# Patient Record
Sex: Female | Born: 1960 | Race: White | Hispanic: No | Marital: Married | Smoking: Current every day smoker
Health system: Southern US, Community
[De-identification: ages and names within clinical notes are randomized; demographics above are authoritative.]

## PROBLEM LIST (undated history)

## (undated) DIAGNOSIS — I10 Essential (primary) hypertension: Secondary | ICD-10-CM

## (undated) DIAGNOSIS — E039 Hypothyroidism, unspecified: Secondary | ICD-10-CM

## (undated) DIAGNOSIS — E079 Disorder of thyroid, unspecified: Secondary | ICD-10-CM

## (undated) HISTORY — PX: CHOLECYSTECTOMY: SHX55

## (undated) HISTORY — PX: ABDOMINAL HYSTERECTOMY: SHX81

## (undated) HISTORY — PX: APPENDECTOMY: SHX54

---

## 2014-02-23 ENCOUNTER — Emergency Department (HOSPITAL_COMMUNITY): Payer: 59

## 2014-02-23 ENCOUNTER — Inpatient Hospital Stay (HOSPITAL_COMMUNITY)
Admission: EM | Admit: 2014-02-23 | Discharge: 2014-02-27 | DRG: 392 | Disposition: A | Discharge status: Home / Self Care | Payer: 59 | Attending: Internal Medicine | Admitting: Internal Medicine

## 2014-02-23 ENCOUNTER — Encounter (HOSPITAL_COMMUNITY): Payer: Self-pay | Admitting: Emergency Medicine

## 2014-02-23 DIAGNOSIS — R739 Hyperglycemia, unspecified: Secondary | ICD-10-CM | POA: Diagnosis present

## 2014-02-23 DIAGNOSIS — E876 Hypokalemia: Secondary | ICD-10-CM | POA: Diagnosis present

## 2014-02-23 DIAGNOSIS — R197 Diarrhea, unspecified: Secondary | ICD-10-CM

## 2014-02-23 DIAGNOSIS — A09 Infectious gastroenteritis and colitis, unspecified: Secondary | ICD-10-CM | POA: Diagnosis not present

## 2014-02-23 DIAGNOSIS — Z833 Family history of diabetes mellitus: Secondary | ICD-10-CM

## 2014-02-23 DIAGNOSIS — Y903 Blood alcohol level of 60-79 mg/100 ml: Secondary | ICD-10-CM | POA: Diagnosis present

## 2014-02-23 DIAGNOSIS — E860 Dehydration: Secondary | ICD-10-CM | POA: Diagnosis present

## 2014-02-23 DIAGNOSIS — E871 Hypo-osmolality and hyponatremia: Secondary | ICD-10-CM | POA: Diagnosis present

## 2014-02-23 DIAGNOSIS — F17210 Nicotine dependence, cigarettes, uncomplicated: Secondary | ICD-10-CM | POA: Diagnosis present

## 2014-02-23 DIAGNOSIS — I10 Essential (primary) hypertension: Secondary | ICD-10-CM | POA: Diagnosis present

## 2014-02-23 DIAGNOSIS — R1030 Lower abdominal pain, unspecified: Secondary | ICD-10-CM

## 2014-02-23 DIAGNOSIS — E039 Hypothyroidism, unspecified: Secondary | ICD-10-CM | POA: Diagnosis present

## 2014-02-23 DIAGNOSIS — R109 Unspecified abdominal pain: Secondary | ICD-10-CM | POA: Diagnosis not present

## 2014-02-23 DIAGNOSIS — F10129 Alcohol abuse with intoxication, unspecified: Secondary | ICD-10-CM | POA: Diagnosis present

## 2014-02-23 DIAGNOSIS — Z9119 Patient's noncompliance with other medical treatment and regimen: Secondary | ICD-10-CM | POA: Diagnosis present

## 2014-02-23 DIAGNOSIS — N179 Acute kidney failure, unspecified: Secondary | ICD-10-CM | POA: Diagnosis present

## 2014-02-23 DIAGNOSIS — E872 Acidosis: Secondary | ICD-10-CM | POA: Diagnosis present

## 2014-02-23 DIAGNOSIS — K529 Noninfective gastroenteritis and colitis, unspecified: Secondary | ICD-10-CM

## 2014-02-23 DIAGNOSIS — K921 Melena: Secondary | ICD-10-CM | POA: Diagnosis present

## 2014-02-23 DIAGNOSIS — D509 Iron deficiency anemia, unspecified: Secondary | ICD-10-CM | POA: Diagnosis present

## 2014-02-23 HISTORY — DX: Essential (primary) hypertension: I10

## 2014-02-23 HISTORY — DX: Disorder of thyroid, unspecified: E07.9

## 2014-02-23 HISTORY — DX: Hypothyroidism, unspecified: E03.9

## 2014-02-23 LAB — CBC WITH DIFFERENTIAL/PLATELET
Basophils Absolute: 0.2 10*3/uL — ABNORMAL HIGH (ref 0.0–0.1)
Basophils Relative: 1 % (ref 0–1)
EOS PCT: 1 % (ref 0–5)
Eosinophils Absolute: 0.2 10*3/uL (ref 0.0–0.7)
HCT: 29.2 % — ABNORMAL LOW (ref 36.0–46.0)
HEMOGLOBIN: 8.4 g/dL — AB (ref 12.0–15.0)
Lymphocytes Relative: 12 % (ref 12–46)
Lymphs Abs: 2.1 10*3/uL (ref 0.7–4.0)
MCH: 20.8 pg — AB (ref 26.0–34.0)
MCHC: 28.8 g/dL — AB (ref 30.0–36.0)
MCV: 72.5 fL — ABNORMAL LOW (ref 78.0–100.0)
MONO ABS: 1.5 10*3/uL — AB (ref 0.1–1.0)
Monocytes Relative: 9 % (ref 3–12)
NEUTROS PCT: 77 % (ref 43–77)
Neutro Abs: 13.1 10*3/uL — ABNORMAL HIGH (ref 1.7–7.7)
Platelets: 360 10*3/uL (ref 150–400)
RBC: 4.03 MIL/uL (ref 3.87–5.11)
RDW: 17.8 % — ABNORMAL HIGH (ref 11.5–15.5)
WBC: 17.1 10*3/uL — ABNORMAL HIGH (ref 4.0–10.5)

## 2014-02-23 LAB — COMPREHENSIVE METABOLIC PANEL
ALBUMIN: 4.5 g/dL (ref 3.5–5.2)
ALK PHOS: 72 U/L (ref 39–117)
ALT: 17 U/L (ref 0–35)
ANION GAP: 22 — AB (ref 5–15)
AST: 35 U/L (ref 0–37)
BUN: 16 mg/dL (ref 6–23)
CO2: 17 mEq/L — ABNORMAL LOW (ref 19–32)
Calcium: 10.1 mg/dL (ref 8.4–10.5)
Chloride: 91 mEq/L — ABNORMAL LOW (ref 96–112)
Creatinine, Ser: 1.25 mg/dL — ABNORMAL HIGH (ref 0.50–1.10)
GFR calc Af Amer: 55 mL/min — ABNORMAL LOW (ref >90)
GFR calc non Af Amer: 48 mL/min — ABNORMAL LOW (ref >90)
Glucose, Bld: 130 mg/dL — ABNORMAL HIGH (ref 70–99)
POTASSIUM: 3 meq/L — AB (ref 3.7–5.3)
Sodium: 130 mEq/L — ABNORMAL LOW (ref 137–147)
TOTAL PROTEIN: 8.3 g/dL (ref 6.0–8.3)
Total Bilirubin: 0.2 mg/dL — ABNORMAL LOW (ref 0.3–1.2)

## 2014-02-23 LAB — PROTIME-INR
INR: 1.12 (ref 0.00–1.49)
Prothrombin Time: 14.6 seconds (ref 11.6–15.2)

## 2014-02-23 LAB — POC OCCULT BLOOD, ED: FECAL OCCULT BLD: POSITIVE — AB

## 2014-02-23 LAB — ETHANOL: Alcohol, Ethyl (B): 75 mg/dL — ABNORMAL HIGH (ref 0–11)

## 2014-02-23 LAB — LIPASE, BLOOD: Lipase: 42 U/L (ref 11–59)

## 2014-02-23 MED ORDER — SODIUM CHLORIDE 0.9 % IV BOLUS (SEPSIS)
1000.0000 mL | Freq: Once | INTRAVENOUS | Status: AC
Start: 2014-02-23 — End: 2014-02-24
  Administered 2014-02-23: 1000 mL via INTRAVENOUS

## 2014-02-23 MED ORDER — MORPHINE SULFATE 2 MG/ML IJ SOLN
2.0000 mg | Freq: Once | INTRAMUSCULAR | Status: AC
Start: 2014-02-23 — End: 2014-02-23
  Administered 2014-02-23: 2 mg via INTRAVENOUS
  Filled 2014-02-23: qty 1

## 2014-02-23 MED ORDER — IOHEXOL 300 MG/ML  SOLN
100.0000 mL | Freq: Once | INTRAMUSCULAR | Status: AC | PRN
  Administered 2014-02-23: 100 mL via INTRAVENOUS

## 2014-02-23 MED ORDER — ONDANSETRON HCL 4 MG/2ML IJ SOLN
4.0000 mg | Freq: Once | INTRAMUSCULAR | Status: AC
  Administered 2014-02-23: 4 mg via INTRAVENOUS
  Filled 2014-02-23: qty 2

## 2014-02-23 NOTE — ED Notes (Signed)
[facility] Expected date:  Expected time:  Means of arrival:  Comments: EMS abd pain / ETOH

## 2014-02-23 NOTE — ED Notes (Signed)
Patient transported to CT 

## 2014-02-23 NOTE — ED Notes (Signed)
Per EMS, patient was picked up from a bar with consuming 6 beers. Patient is complaining of abd pain that started 4 days ago but got worse after drinking. Pain rated 10/10. Also, reported to EMS that patient is constipated and last bowel movement was one week ago. Attempted to use suppository with no relief.

## 2014-02-23 NOTE — ED Notes (Signed)
Pt stated she wanted to use bathroom at this time--- requested a bedpan.  Was encouraged by staff to use bathroom----- able to ambulate to the bathroom without assistance, gait and balance steady.

## 2014-02-23 NOTE — ED Provider Notes (Signed)
CSN: [id number]     Arrival date & time 02/23/14  2115 History   First MD Initiated Contact with Patient 02/23/14 2139     Chief Complaint  Patient presents with  . Abdominal Pain  . Alcohol Intoxication  . Constipation     (Consider location/radiation/quality/duration/timing/severity/associated sxs/prior Treatment) Patient is a 53 y.o. female presenting with abdominal pain, intoxication, and constipation. The history is provided by the patient and medical records. No language interpreter was used.  Abdominal Pain Associated symptoms: constipation and nausea   Associated symptoms: no chest pain, no cough, no diarrhea, no dysuria, no fatigue, no fever, no hematuria, no shortness of breath and no vomiting   Alcohol Intoxication Associated symptoms include abdominal pain and nausea. Pertinent negatives include no chest pain, coughing, diaphoresis, fatigue, fever, headaches, neck pain, rash, vomiting or weakness.  Constipation Associated symptoms: abdominal pain and nausea   Associated symptoms: no back pain, no diarrhea, no dysuria, no fever and no vomiting      Chandra BatchSherry Spindler is a 53 y.o. female  with a hx of HTN, hypothyroid presents to the Emergency Department complaining of gradual, persistent, progressively worsening lower abd pain onset 1 hour PTA.  Pt reports constipation for the last 1 week without any BMs.  She reports she and a friend went out tonight and she had 6+ beers at the bar.  Pt reports this is not a lot of EtOH for her but denies drinking regularly. Pt reports she became diaphoretic and thought she might vomit, but was unable to do so.  She reports hysterectomy but denies additional abd surgeries.  Pt denies fever, chills, headache, neck pain, chest pain, SOB, dysuria, hematuria.  Pt reports after arriving in the ED she has had several bouts of BRBPR.  Pt denies blood thinner or hx of hemorrhoids.    Past Medical History  Diagnosis Date  . Hypertension   . Thyroid  disease   . Hypothyroidism    Past Surgical History  Procedure Laterality Date  . Appendectomy    . Abdominal hysterectomy    . Cesarean section    . Cholecystectomy     History reviewed. No pertinent family history. History  Substance Use Topics  . Smoking status: Current Every Day Smoker  . Smokeless tobacco: Never Used  . Alcohol Use: Yes   OB History    No data available     Review of Systems  Constitutional: Negative for fever, diaphoresis, appetite change, fatigue and unexpected weight change.  HENT: Negative for mouth sores and trouble swallowing.   Eyes: Negative for visual disturbance.  Respiratory: Negative for cough, chest tightness, shortness of breath, wheezing and stridor.   Cardiovascular: Negative for chest pain and palpitations.  Gastrointestinal: Positive for nausea, abdominal pain, constipation and blood in stool. Negative for vomiting, diarrhea, abdominal distention and rectal pain.  Endocrine: Negative for polydipsia, polyphagia and polyuria.  Genitourinary: Negative for dysuria, urgency, frequency, hematuria, flank pain and difficulty urinating.  Musculoskeletal: Negative for back pain, neck pain and neck stiffness.  Skin: Negative for rash.  Allergic/Immunologic: Negative for immunocompromised state.  Neurological: Negative for syncope, weakness, light-headedness and headaches.  Hematological: Negative for adenopathy. Does not bruise/bleed easily.  Psychiatric/Behavioral: Negative for confusion and sleep disturbance. The patient is not nervous/anxious.   All other systems reviewed and are negative.     Allergies  Sulfa antibiotics  Home Medications   Prior to Admission medications   Medication Sig Start Date End Date Taking? Authorizing Provider  ibuprofen (  ADVIL,MOTRIN) 200 MG tablet Take 800 mg by mouth every 6 (six) hours as needed for moderate pain.   Yes Historical Provider, MD  lisinopril (PRINIVIL,ZESTRIL) 20 MG tablet Take 20 mg by mouth  daily.   Yes Historical Provider, MD  mometasone (NASONEX) 50 MCG/ACT nasal spray Place 1 spray into the nose 2 (two) times daily.   Yes Historical Provider, MD   BP 147/57 mmHg  Pulse 69  Temp(Src) 97.4 F (36.3 C) (Oral)  Resp 20  SpO2 99% Physical Exam  Constitutional: She is oriented to person, place, and time. She appears well-developed and well-nourished. No distress.  Awake, alert, nontoxic appearance  HENT:  Head: Normocephalic and atraumatic.  Mouth/Throat: Oropharynx is clear and moist. No oropharyngeal exudate.  Eyes: Conjunctivae are normal. No scleral icterus.  Neck: Normal range of motion. Neck supple.  Cardiovascular: Normal rate, regular rhythm, normal heart sounds and intact distal pulses.   Pulmonary/Chest: Effort normal and breath sounds normal. No respiratory distress. She has no wheezes.  Equal chest expansion  Abdominal: Soft. Bowel sounds are normal. She exhibits no distension and no mass. There is tenderness in the right lower quadrant, suprapubic area and left lower quadrant. There is guarding. There is no CVA tenderness.  Significant TTP of the lower abd without lateralization of the pain; pt denies rebound tenderness but is very uncomfortable during exam; no TTP of the epigastrium RUQ or LUQ  Obese abd  Musculoskeletal: Normal range of motion. She exhibits no edema.  Neurological: She is alert and oriented to person, place, and time. Coordination normal.  Speech is clear and goal oriented Moves extremities without ataxia  Skin: Skin is warm and dry. She is not diaphoretic. No erythema.  Psychiatric: She has a normal mood and affect.  Nursing note and vitals reviewed.   ED Course  Procedures (including critical care time) Labs Review Labs Reviewed  COMPREHENSIVE METABOLIC PANEL - Abnormal; Notable for the following:    Sodium 130 (*)    Potassium 3.0 (*)    Chloride 91 (*)    CO2 17 (*)    Glucose, Bld 130 (*)    Creatinine, Ser 1.25 (*)    Total  Bilirubin 0.2 (*)    GFR calc non Af Amer 48 (*)    GFR calc Af Amer 55 (*)    Anion gap 22 (*)    All other components within normal limits  CBC WITH DIFFERENTIAL - Abnormal; Notable for the following:    WBC 17.1 (*)    Hemoglobin 8.4 (*)    HCT 29.2 (*)    MCV 72.5 (*)    MCH 20.8 (*)    MCHC 28.8 (*)    RDW 17.8 (*)    Neutro Abs 13.1 (*)    Monocytes Absolute 1.5 (*)    Basophils Absolute 0.2 (*)    All other components within normal limits  ETHANOL - Abnormal; Notable for the following:    Alcohol, Ethyl (B) 75 (*)    All other components within normal limits  POC OCCULT BLOOD, ED - Abnormal; Notable for the following:    Fecal Occult Bld POSITIVE (*)    All other components within normal limits  LIPASE, BLOOD  URINALYSIS, ROUTINE W REFLEX MICROSCOPIC  PROTIME-INR  I-STAT CG4 LACTIC ACID, ED    Imaging Review Ct Abdomen Pelvis W Contrast  02/24/2014   CLINICAL DATA:  Acute lower abdominal pain LEFT greater than RIGHT onset tonight, nausea, vomiting, past history of hysterectomy and  appendectomy, hypertension, smoking  EXAM: CT ABDOMEN AND PELVIS WITH CONTRAST  TECHNIQUE: Multidetector CT imaging of the abdomen and pelvis was performed using the standard protocol following bolus administration of intravenous contrast. Sagittal and coronal MPR images reconstructed from axial data set.  CONTRAST:  OMNIPAQUE IOHEXOL 300 MG/ML  SOLN  COMPARISON:  None  FINDINGS: Minimal dependent density lash atelectasis at LEFT lower lobe.  Gallbladder, appendix, and uterus surgically absent with normal sized ovaries.  Liver, spleen, pancreas, kidneys, and adrenal glands normal appearance.  Although under distended, suspect diffuse wall thickening of the transverse and descending colon, questionably extending into the sigmoid colon as well, suspicious for a diffuse colitis.  Stomach and small bowel loops normal appearance.  No mass, adenopathy, hernia, free fluid, or free air.  Mild  degenerative disc disease changes thoracic spine and at L5-S1.  IMPRESSION: Suspect diffuse colitis involving the transverse, descending, and likely sigmoid colon.  Differential diagnosis would include infection and inflammatory bowel disease, ischemia considered unlikely due to distribution and lack of significant vascular-occlusive disease changes.   Electronically Signed   By: Ulyses Southward M.D.   On: 02/24/2014 00:18     EKG Interpretation None      MDM   Final diagnoses:  Lower abdominal pain  Colitis  Bloody diarrhea   Veverly Larimer presents with constipation, lower abd pain and c/o bloody diarrhea here in the ED.  Pt with 6+ beers tonight and appears under the influence.  No emesis here in the department.  Will obtain labs, CT abd/pelvis.     12:31 AM Patient is afebrile but she does have leukocytosis of 17.1. Hypokalemia at 3.0, hyponatremia at 130 and decreased bicarbonate 17. Creatinine 1.5.  Patient is receiving fluids. Pain remains present. Will give Dilaudid 0.5 mg.  Urinalysis without evidence of urinary tract infection.  CT with diffuse colitis involving the transverse, descending and likely sigmoid colon consistent with patient's abdominal exam.  Patient remains with significant nausea and abdominal pain. Patient likely will need admission as I have been unable to come trocar pain and I do not believe that she will tolerate by mouth's.  Lactic acid 2.17.  Pt is receiving a second fluid bolus.  1:53 AM Pt discussed with Dr. Toniann Fail and pt will be admitted to Med-surg.    BP 147/57 mmHg  Pulse 69  Temp(Src) 97.4 F (36.3 C) (Oral)  Resp 20  SpO2 99%   Dierdre Forth, PA-C 02/24/14 0153  Toy Cookey, MD 02/24/14 1652

## 2014-02-24 ENCOUNTER — Encounter (HOSPITAL_COMMUNITY): Payer: Self-pay | Admitting: Internal Medicine

## 2014-02-24 DIAGNOSIS — Z9119 Patient's noncompliance with other medical treatment and regimen: Secondary | ICD-10-CM | POA: Diagnosis present

## 2014-02-24 DIAGNOSIS — R739 Hyperglycemia, unspecified: Secondary | ICD-10-CM | POA: Diagnosis present

## 2014-02-24 DIAGNOSIS — N179 Acute kidney failure, unspecified: Secondary | ICD-10-CM | POA: Diagnosis present

## 2014-02-24 DIAGNOSIS — E039 Hypothyroidism, unspecified: Secondary | ICD-10-CM | POA: Diagnosis present

## 2014-02-24 DIAGNOSIS — D509 Iron deficiency anemia, unspecified: Secondary | ICD-10-CM | POA: Diagnosis present

## 2014-02-24 DIAGNOSIS — E876 Hypokalemia: Secondary | ICD-10-CM | POA: Diagnosis present

## 2014-02-24 DIAGNOSIS — F17210 Nicotine dependence, cigarettes, uncomplicated: Secondary | ICD-10-CM | POA: Diagnosis present

## 2014-02-24 DIAGNOSIS — Y903 Blood alcohol level of 60-79 mg/100 ml: Secondary | ICD-10-CM | POA: Diagnosis present

## 2014-02-24 DIAGNOSIS — F10129 Alcohol abuse with intoxication, unspecified: Secondary | ICD-10-CM | POA: Diagnosis present

## 2014-02-24 DIAGNOSIS — Z833 Family history of diabetes mellitus: Secondary | ICD-10-CM | POA: Diagnosis not present

## 2014-02-24 DIAGNOSIS — A09 Infectious gastroenteritis and colitis, unspecified: Principal | ICD-10-CM

## 2014-02-24 DIAGNOSIS — K529 Noninfective gastroenteritis and colitis, unspecified: Secondary | ICD-10-CM | POA: Diagnosis present

## 2014-02-24 DIAGNOSIS — E871 Hypo-osmolality and hyponatremia: Secondary | ICD-10-CM | POA: Diagnosis present

## 2014-02-24 DIAGNOSIS — I10 Essential (primary) hypertension: Secondary | ICD-10-CM | POA: Diagnosis present

## 2014-02-24 DIAGNOSIS — R197 Diarrhea, unspecified: Secondary | ICD-10-CM | POA: Insufficient documentation

## 2014-02-24 DIAGNOSIS — E872 Acidosis: Secondary | ICD-10-CM | POA: Diagnosis present

## 2014-02-24 DIAGNOSIS — K921 Melena: Secondary | ICD-10-CM | POA: Diagnosis present

## 2014-02-24 DIAGNOSIS — E860 Dehydration: Secondary | ICD-10-CM | POA: Diagnosis present

## 2014-02-24 DIAGNOSIS — R109 Unspecified abdominal pain: Secondary | ICD-10-CM | POA: Diagnosis present

## 2014-02-24 LAB — CREATININE, URINE, RANDOM: Creatinine, Urine: 103.62 mg/dL

## 2014-02-24 LAB — COMPREHENSIVE METABOLIC PANEL
ALT: 15 U/L (ref 0–35)
ANION GAP: 14 (ref 5–15)
AST: 31 U/L (ref 0–37)
Albumin: 4.2 g/dL (ref 3.5–5.2)
Alkaline Phosphatase: 70 U/L (ref 39–117)
BUN: 14 mg/dL (ref 6–23)
CO2: 22 meq/L (ref 19–32)
Calcium: 8.9 mg/dL (ref 8.4–10.5)
Chloride: 94 mEq/L — ABNORMAL LOW (ref 96–112)
Creatinine, Ser: 1.06 mg/dL (ref 0.50–1.10)
GFR, EST AFRICAN AMERICAN: 68 mL/min — AB (ref >90)
GFR, EST NON AFRICAN AMERICAN: 58 mL/min — AB (ref >90)
GLUCOSE: 141 mg/dL — AB (ref 70–99)
POTASSIUM: 3.9 meq/L (ref 3.7–5.3)
Sodium: 130 mEq/L — ABNORMAL LOW (ref 137–147)
Total Protein: 7.8 g/dL (ref 6.0–8.3)

## 2014-02-24 LAB — CBC
HEMATOCRIT: 27.5 % — AB (ref 36.0–46.0)
Hemoglobin: 7.9 g/dL — ABNORMAL LOW (ref 12.0–15.0)
MCH: 21 pg — ABNORMAL LOW (ref 26.0–34.0)
MCHC: 28.7 g/dL — ABNORMAL LOW (ref 30.0–36.0)
MCV: 72.9 fL — ABNORMAL LOW (ref 78.0–100.0)
Platelets: 306 10*3/uL (ref 150–400)
RBC: 3.77 MIL/uL — ABNORMAL LOW (ref 3.87–5.11)
RDW: 17.8 % — ABNORMAL HIGH (ref 11.5–15.5)
WBC: 15.5 10*3/uL — AB (ref 4.0–10.5)

## 2014-02-24 LAB — T3: T3 TOTAL: 13.3 ng/dL — AB (ref 80.0–204.0)

## 2014-02-24 LAB — URINALYSIS, ROUTINE W REFLEX MICROSCOPIC
BILIRUBIN URINE: NEGATIVE
Glucose, UA: NEGATIVE mg/dL
HGB URINE DIPSTICK: NEGATIVE
KETONES UR: NEGATIVE mg/dL
Leukocytes, UA: NEGATIVE
NITRITE: NEGATIVE
Protein, ur: NEGATIVE mg/dL
Specific Gravity, Urine: 1.016 (ref 1.005–1.030)
UROBILINOGEN UA: 0.2 mg/dL (ref 0.0–1.0)
pH: 6 (ref 5.0–8.0)

## 2014-02-24 LAB — RETICULOCYTES
RBC.: 3.7 MIL/uL — ABNORMAL LOW (ref 3.87–5.11)
RETIC COUNT ABSOLUTE: 44.4 10*3/uL (ref 19.0–186.0)
Retic Ct Pct: 1.2 % (ref 0.4–3.1)

## 2014-02-24 LAB — IRON AND TIBC
IRON: 10 ug/dL — AB (ref 42–135)
SATURATION RATIOS: 2 % — AB (ref 20–55)
TIBC: 486 ug/dL — ABNORMAL HIGH (ref 250–470)
UIBC: 476 ug/dL — ABNORMAL HIGH (ref 125–400)

## 2014-02-24 LAB — HEMOGLOBIN A1C
Hgb A1c MFr Bld: 5.9 % — ABNORMAL HIGH (ref <5.7)
MEAN PLASMA GLUCOSE: 123 mg/dL — AB (ref <117)

## 2014-02-24 LAB — I-STAT CG4 LACTIC ACID, ED: LACTIC ACID, VENOUS: 2.17 mmol/L (ref 0.5–2.2)

## 2014-02-24 LAB — T4, FREE: Free T4: 0.18 ng/dL — ABNORMAL LOW (ref 0.80–1.80)

## 2014-02-24 LAB — MAGNESIUM: Magnesium: 2.1 mg/dL (ref 1.5–2.5)

## 2014-02-24 LAB — SODIUM, URINE, RANDOM: Sodium, Ur: 98 mEq/L

## 2014-02-24 LAB — TSH: TSH: >100 u[IU]/mL — ABNORMAL HIGH (ref 0.350–4.500)

## 2014-02-24 LAB — ABO/RH: ABO/RH(D): B POS

## 2014-02-24 MED ORDER — ACETAMINOPHEN 650 MG RE SUPP: 650.0000 mg | Freq: Four times a day (QID) | RECTAL | Status: DC | PRN

## 2014-02-24 MED ORDER — HYDROMORPHONE HCL 1 MG/ML IJ SOLN
0.5000 mg | Freq: Once | INTRAMUSCULAR | Status: AC
  Administered 2014-02-24: 0.5 mg via INTRAVENOUS
  Filled 2014-02-24: qty 1

## 2014-02-24 MED ORDER — HYDROMORPHONE HCL 1 MG/ML IJ SOLN
1.0000 mg | INTRAMUSCULAR | Status: DC | PRN
  Administered 2014-02-24 – 2014-02-25 (×4): 1 mg via INTRAVENOUS
  Filled 2014-02-24 (×4): qty 1

## 2014-02-24 MED ORDER — ACETAMINOPHEN 325 MG PO TABS: 650.0000 mg | ORAL_TABLET | Freq: Four times a day (QID) | ORAL | Status: DC | PRN

## 2014-02-24 MED ORDER — HYDROMORPHONE HCL 1 MG/ML IJ SOLN: 1.0000 mg | Freq: Once | INTRAMUSCULAR | Status: DC

## 2014-02-24 MED ORDER — METRONIDAZOLE IN NACL 5-0.79 MG/ML-% IV SOLN
500.0000 mg | Freq: Three times a day (TID) | INTRAVENOUS | Status: DC
  Administered 2014-02-24 – 2014-02-27 (×10): 500 mg via INTRAVENOUS
  Filled 2014-02-24 (×11): qty 100

## 2014-02-24 MED ORDER — SODIUM CHLORIDE 0.9 % IV SOLN
Freq: Once | INTRAVENOUS | Status: AC
  Administered 2014-02-24: 03:00:00 via INTRAVENOUS

## 2014-02-24 MED ORDER — POTASSIUM CHLORIDE IN NACL 20-0.9 MEQ/L-% IV SOLN
INTRAVENOUS | Status: AC
  Administered 2014-02-24 (×2): via INTRAVENOUS
  Filled 2014-02-24 (×3): qty 1000

## 2014-02-24 MED ORDER — LEVOTHYROXINE SODIUM 100 MCG PO TABS
100.0000 ug | ORAL_TABLET | Freq: Every day | ORAL | Status: DC
  Administered 2014-02-24: 100 ug via ORAL
  Filled 2014-02-24 (×2): qty 1

## 2014-02-24 MED ORDER — CIPROFLOXACIN IN D5W 400 MG/200ML IV SOLN
400.0000 mg | Freq: Once | INTRAVENOUS | Status: AC
  Administered 2014-02-24: 400 mg via INTRAVENOUS
  Filled 2014-02-24: qty 200

## 2014-02-24 MED ORDER — LEVOTHYROXINE SODIUM 75 MCG PO TABS
75.0000 ug | ORAL_TABLET | Freq: Every day | ORAL | Status: DC
  Administered 2014-02-25 – 2014-02-27 (×3): 75 ug via ORAL
  Filled 2014-02-24 (×5): qty 1

## 2014-02-24 MED ORDER — ONDANSETRON HCL 4 MG PO TABS: 4.0000 mg | ORAL_TABLET | Freq: Four times a day (QID) | ORAL | Status: DC | PRN

## 2014-02-24 MED ORDER — HYDROMORPHONE HCL 1 MG/ML IJ SOLN: 1.0000 mg | INTRAMUSCULAR | Status: DC | PRN

## 2014-02-24 MED ORDER — ONDANSETRON HCL 4 MG/2ML IJ SOLN: 4.0000 mg | Freq: Four times a day (QID) | INTRAMUSCULAR | Status: DC | PRN

## 2014-02-24 MED ORDER — FLUTICASONE PROPIONATE 50 MCG/ACT NA SUSP
2.0000 | Freq: Every day | NASAL | Status: DC
  Administered 2014-02-24 – 2014-02-27 (×4): 2 via NASAL
  Filled 2014-02-24: qty 16

## 2014-02-24 MED ORDER — SODIUM CHLORIDE 0.9 % IV BOLUS (SEPSIS)
500.0000 mL | Freq: Once | INTRAVENOUS | Status: AC
  Administered 2014-02-24: 500 mL via INTRAVENOUS

## 2014-02-24 MED ORDER — LISINOPRIL 20 MG PO TABS
20.0000 mg | ORAL_TABLET | Freq: Every day | ORAL | Status: DC
  Administered 2014-02-24 – 2014-02-27 (×4): 20 mg via ORAL
  Filled 2014-02-24 (×4): qty 1

## 2014-02-24 MED ORDER — MORPHINE SULFATE 4 MG/ML IJ SOLN
4.0000 mg | Freq: Once | INTRAMUSCULAR | Status: AC
  Administered 2014-02-24: 4 mg via INTRAVENOUS
  Filled 2014-02-24: qty 1

## 2014-02-24 MED ORDER — METRONIDAZOLE IN NACL 5-0.79 MG/ML-% IV SOLN
500.0000 mg | Freq: Once | INTRAVENOUS | Status: AC
  Administered 2014-02-24: 500 mg via INTRAVENOUS
  Filled 2014-02-24: qty 100

## 2014-02-24 MED ORDER — CIPROFLOXACIN IN D5W 400 MG/200ML IV SOLN
400.0000 mg | Freq: Two times a day (BID) | INTRAVENOUS | Status: DC
  Administered 2014-02-24 – 2014-02-27 (×7): 400 mg via INTRAVENOUS
  Filled 2014-02-24 (×8): qty 200

## 2014-02-24 MED ORDER — HYDRALAZINE HCL 20 MG/ML IJ SOLN: 10.0000 mg | INTRAMUSCULAR | Status: DC | PRN

## 2014-02-24 MED ORDER — POTASSIUM CHLORIDE CRYS ER 20 MEQ PO TBCR
40.0000 meq | EXTENDED_RELEASE_TABLET | Freq: Once | ORAL | Status: AC
  Administered 2014-02-24: 40 meq via ORAL
  Filled 2014-02-24: qty 2

## 2014-02-24 NOTE — Progress Notes (Signed)
ANTIBIOTIC CONSULT NOTE - INITIAL  Pharmacy Consult for cipro Indication: Colitis  Allergies  Allergen Reactions  . Sulfa Antibiotics Shortness Of Breath and Swelling    Throat swells     Patient Measurements: Height: 5' 7.5" (171.5 cm) Weight: 247 lb 3.2 oz (112.129 kg) IBW/kg (Calculated) : 62.75 Adjusted Body Weight:   Vital Signs: Temp: 97.5 F (36.4 C) (12/10 0431) Temp Source: Oral (12/10 0431) BP: 150/60 mmHg (12/10 0431) Pulse Rate: 58 (12/10 0431) Intake/Output from previous day:   Intake/Output from this shift:    Labs:  Recent Labs  02/23/14 2222 02/24/14 0500  WBC 17.1* 15.5*  HGB 8.4* 7.9*  PLT 360 306  CREATININE 1.25*  --    Estimated Creatinine Clearance: 67 mL/min (by C-G formula based on Cr of 1.25). No results for input(s): VANCOTROUGH, VANCOPEAK, VANCORANDOM, GENTTROUGH, GENTPEAK, GENTRANDOM, TOBRATROUGH, TOBRAPEAK, TOBRARND, AMIKACINPEAK, AMIKACINTROU, AMIKACIN in the last 72 hours.   Microbiology: No results found for this or any previous visit (from the past 720 hour(s)).  Medical History: Past Medical History  Diagnosis Date  . Hypertension   . Thyroid disease   . Hypothyroidism     Medications:  Anti-infectives    Start     Dose/Rate Route Frequency Ordered Stop   02/24/14 1000  ciprofloxacin (CIPRO) IVPB 400 mg     400 mg200 mL/hr over 60 Minutes Intravenous Every 12 hours 02/24/14 0545     02/24/14 0800  metroNIDAZOLE (FLAGYL) IVPB 500 mg     500 mg100 mL/hr over 60 Minutes Intravenous Every 8 hours 02/24/14 0328     02/24/14 0145  ciprofloxacin (CIPRO) IVPB 400 mg     400 mg200 mL/hr over 60 Minutes Intravenous  Once 02/24/14 0135 02/24/14 0504   02/24/14 0145  metroNIDAZOLE (FLAGYL) IVPB 500 mg     500 mg100 mL/hr over 60 Minutes Intravenous  Once 02/24/14 0135 02/24/14 [id number]     Assessment: Patient with colitis.    Goal of Therapy:  Cipro dosed based on patient weight and renal function   Plan:  Follow up culture  results Cipro 400mg  iv q12hr  Darlina GuysGrimsley Jr, Jacquenette ShoneJulian Crowford 02/24/2014,5:49 AM

## 2014-02-24 NOTE — Progress Notes (Signed)
Patient ID: Vanessa Peters  female  ZOX:[id number]    DOB: 09/29/1960    DOA: 02/23/2014  PCP: No primary care provider on file.  Brief history of present illness Patient is a 53 year old female with hypertension presented to ED with abdominal pain, while waiting in ED, had 5 bloody bowel movements. Described abdominal pain as diffuse, was on antibiotics 2 months ago for bronchitis. CT abdomen and pelvis showed diffuse colitis. Lab work showed microcytic hypochromic anemia otherwise hemodynamically stable. Patient also reported that she has not been to any PCP for last 10 years.  Assessment/Plan: Principal Problem:  Acute diffuse Colitis - CT abdomen showed diffuse colitis involving transverse, descending, sigmoid colon. FOBT positive -C. difficile, Giardia pathogen panel pending - Continue IV Cipro and Flagyl Flagyl -Continue clears   -Will need screening colonoscopy once symptoms have improved     Active Problems:   Microcytic anemia: Suggestive of iron deficiency anemia  - Place on iron supplements      Essential hypertension - Currently stable     Acute renal failure - Improving, likely due to dehydration and acute colitis  - Continue IV hydration     Hyperglycemia - will check hemoglobin A1c to rule out any underlying diabetes      profound Hypothyroidism - TSH above 100, ordered stat T3 and T4  - Patient reports that she used to be on Synthroid 75 MCG daily but has not seen any physician in many years. Will start patient on Synthroid 100 MCG , dose will need to be adjusted according to TSH levels in 4-6 weeks. I strongly advised the patient to follow-up with PCP, case management consult placed for assistance for PCP.    DVT Prophylaxis:  Code Status: SCDs   Family Communication: discussed with husband at bedside   Disposition:  Consultants:None  Procedures :CT abdomen  Antibiotics    IV Cipro, IV Flagyl 12/10 >  Subjective:  Patient seen and examined,  still having abdominal discomfort, no diarrhea, nausea or vomiting or fevers   ective: Weight change:  No intake or output data in the 24 hours ending 02/24/14 1137 Blood pressure 150/60, pulse 58, temperature 97.5 F (36.4 C), temperature source Oral, resp. rate 16, height 5' 7.5" (1.715 m), weight 112.129 kg (247 lb 3.2 oz), SpO2 98 %.  Physical Exam: General: Alert and awake, oriented x3, not in any acute distress. CVS: S1-S2 clear, no murmur rubs or gallops Chest: clear to auscultation bilaterally, no wheezing, rales or rhonchi Abdomen: soft  mild diffuse tenderness, nondistended, normal bowel sounds  Extremities: no cyanosis, clubbing or edema noted bilaterally Neuro: Cranial nerves II-XII intact, no focal neurological deficits  Lab Results: Basic Metabolic Panel:  Recent Labs Lab 02/23/14 2222 02/24/14 0500  NA 130* 130*  K 3.0* 3.9  CL 91* 94*  CO2 17* 22  GLUCOSE 130* 141*  BUN 16 14  CREATININE 1.25* 1.06  CALCIUM 10.1 8.9  MG  --  2.1   Liver Function Tests:  Recent Labs Lab 02/23/14 2222 02/24/14 0500  AST 35 31  ALT 17 15  ALKPHOS 72 70  BILITOT 0.2* <0.2*  PROT 8.3 7.8  ALBUMIN 4.5 4.2    Recent Labs Lab 02/23/14 2222  LIPASE 42   No results for input(s): AMMONIA in the last 168 hours. CBC:  Recent Labs Lab 02/23/14 2222 02/24/14 0500  WBC 17.1* 15.5*  NEUTROABS 13.1*  --   HGB 8.4* 7.9*  HCT 29.2* 27.5*  MCV 72.5* 72.9*  PLT 360 306   Cardiac Enzymes: No results for input(s): CKTOTAL, CKMB, CKMBINDEX, TROPONINI in the last 168 hours. BNP: Invalid input(s): POCBNP CBG: No results for input(s): GLUCAP in the last 168 hours.   Micro Results: No results found for this or any previous visit (from the past 240 hour(s)).  Studies/Results: Ct Abdomen Pelvis W Contrast  02/24/2014   CLINICAL DATA:  Acute lower abdominal pain LEFT greater than RIGHT onset tonight, nausea, vomiting, past history of hysterectomy and appendectomy,  hypertension, smoking  EXAM: CT ABDOMEN AND PELVIS WITH CONTRAST  TECHNIQUE: Multidetector CT imaging of the abdomen and pelvis was performed using the standard protocol following bolus administration of intravenous contrast. Sagittal and coronal MPR images reconstructed from axial data set.  CONTRAST:  100mL OMNIPAQUE IOHEXOL 300 MG/ML  SOLN  COMPARISON:  None  FINDINGS: Minimal dependent density lash atelectasis at LEFT lower lobe.  Gallbladder, appendix, and uterus surgically absent with normal sized ovaries.  Liver, spleen, pancreas, kidneys, and adrenal glands normal appearance.  Although under distended, suspect diffuse wall thickening of the transverse and descending colon, questionably extending into the sigmoid colon as well, suspicious for a diffuse colitis.  Stomach and small bowel loops normal appearance.  No mass, adenopathy, hernia, free fluid, or free air.  Mild degenerative disc disease changes thoracic spine and at L5-S1.  IMPRESSION: Suspect diffuse colitis involving the transverse, descending, and likely sigmoid colon.  Differential diagnosis would include infection and inflammatory bowel disease, ischemia considered unlikely due to distribution and lack of significant vascular-occlusive disease changes.   Electronically Signed   By: Ulyses SouthwardMark  Boles M.D.   On: 02/24/2014 00:18    Medications: Scheduled Meds: . ciprofloxacin  400 mg Intravenous Q12H  . fluticasone  2 spray Each Nare Daily  . levothyroxine  100 mcg Oral QAC breakfast  . lisinopril  20 mg Oral Daily  . metronidazole  500 mg Intravenous Q8H      LOS: 1 day   RAI,RIPUDEEP M.D. [facility] 02/24/2014, 11:37 AM Pager: [phone]  If 7PM-7AM, please contact night-coverage [email or web address] Password [email or web address]

## 2014-02-24 NOTE — ED Notes (Signed)
Dr. Kakrakandy at bedside. 

## 2014-02-24 NOTE — Progress Notes (Signed)
CARE MANAGEMENT NOTE 02/24/2014  Patient:  Vanessa Peters,Vanessa Peters   Account Number:  [id number]  Date Initiated:  02/24/2014  Documentation initiated by:  Ferdinand CavaSCHETTINO,Mahamadou Weltz  Subjective/Objective Assessment:   [age] yo female admitted with colitis from home with spouse     Action/Plan:   Needs PCP   Anticipated DC Date:  02/26/2014   Anticipated DC Plan:  HOME/SELF CARE      DC Planning Services  CM consult  PCP issues      Choice offered to / List presented to:  C-1 Patient           Status of service:  In process, will continue to follow Medicare Important Message given?   (If response is "NO", the following Medicare IM given date fields will be blank) Date Medicare IM given:   Medicare IM given by:   Date Additional Medicare IM given:   Additional Medicare IM given by:    Discharge Disposition:    Per UR Regulation:    If discussed at Long Length of Stay Meetings, dates discussed:    Comments:  02/24/14 Ferdinand CavaAndrea Schettino RN BSN CM [phone] Spoke with patient and she confirmed that she has [employer] coverage and had her card with her. Printed out a list of PCP[facility] and gave to patient. Advised patient to contact PCP of choice from list and this CM will follow up on appointment information.

## 2014-02-24 NOTE — ED Notes (Signed)
Dr. Docherty at bedside.

## 2014-02-24 NOTE — H&P (Signed)
[facility] History and Physical  Vanessa BatchSherry Mcnealy EAV:[id number] DOB: 11/13/1960 DOA: 02/23/2014  Referring physician: ER physician. PCP: No primary care provider on file.   Chief Complaint: Abdominal pain.  HPI: Vanessa Peters is a 53 y.o. female with history of hypertension presents to the ER because of abdominal pain. While waiting in the ER patient had 5 bloody bowel movements. His abdominal pain is diffuse. Patient has had antibiotics 2 months ago for bronchitis. CT abdomen and pelvis done in the ER shows diffuse colitis. Patient's lab show macrocytic hypochromic anemia. Patient is hemodynamically stable otherwise. Patient denies any recent travel or any sick contacts. Patient is afebrile. Denies consuming any" further be from recently. Patient was constipated 2 days ago and had taken per rectal suppository yesterday. Patient has history of hypertension and does not take antihypertensive on a regular basis.   Review of Systems: As presented in the history of presenting illness, rest negative.  Past Medical History  Diagnosis Date  . Hypertension   . Thyroid disease   . Hypothyroidism    Past Surgical History  Procedure Laterality Date  . Appendectomy    . Abdominal hysterectomy    . Cesarean section    . Cholecystectomy     Social History:  reports that she has been smoking.  She has never used smokeless tobacco. She reports that she drinks alcohol. She reports that she does not use illicit drugs. Where does patient live home. Can patient participate in ADLs? Yes.  Allergies  Allergen Reactions  . Sulfa Antibiotics Shortness Of Breath and Swelling    Throat swells     Family History:  Family History  Problem Relation Age of Onset  . Diabetes Mellitus II Mother   . Diabetes Mellitus II Father       Prior to Admission medications   Medication Sig Start Date End Date Taking? Authorizing Provider  ibuprofen (ADVIL,MOTRIN) 200 MG tablet Take 800 mg by mouth every  6 (six) hours as needed for moderate pain.   Yes Historical Provider, MD  lisinopril (PRINIVIL,ZESTRIL) 20 MG tablet Take 20 mg by mouth daily.   Yes Historical Provider, MD  mometasone (NASONEX) 50 MCG/ACT nasal spray Place 1 spray into the nose 2 (two) times daily.   Yes Historical Provider, MD    Physical Exam: Filed Vitals:   02/23/14 2103 02/24/14 0003 02/24/14 0217 02/24/14 0252  BP: 111/47 147/57 146/58 189/79  Pulse: 99 69 65 64  Temp: 97.4 F (36.3 C)   97.6 F (36.4 C)  TempSrc: Oral   Oral  Resp: 24 20 18 18   Height:    5' 7.5" (1.715 m)  Weight:    112.129 kg (247 lb 3.2 oz)  SpO2: 95% 99% 97% 100%     General:  Well-developed and nourished.  Eyes: Anicteric no pallor.  ENT: No discharge from the ears eyes nose mouth.  Neck: No mass felt.  Cardiovascular: S1 and S2 heard.  Respiratory: No rhonchi or crepitations.  Abdomen: Soft nontender bowel sounds present.  Skin: No rash.  Musculoskeletal: No edema.  Psychiatric: Appears normal.  Neurologic: Alert awake oriented to time place and person. Moves all extremities.  Labs on Admission:  Basic Metabolic Panel:  Recent Labs Lab 02/23/14 2222  NA 130*  K 3.0*  CL 91*  CO2 17*  GLUCOSE 130*  BUN 16  CREATININE 1.25*  CALCIUM 10.1   Liver Function Tests:  Recent Labs Lab 02/23/14 2222  AST 35  ALT 17  ALKPHOS  72  BILITOT 0.2*  PROT 8.3  ALBUMIN 4.5    Recent Labs Lab 02/23/14 2222  LIPASE 42   No results for input(s): AMMONIA in the last 168 hours. CBC:  Recent Labs Lab 02/23/14 2222  WBC 17.1*  NEUTROABS 13.1*  HGB 8.4*  HCT 29.2*  MCV 72.5*  PLT 360   Cardiac Enzymes: No results for input(s): CKTOTAL, CKMB, CKMBINDEX, TROPONINI in the last 168 hours.  BNP (last 3 results) No results for input(s): PROBNP in the last 8760 hours. CBG: No results for input(s): GLUCAP in the last 168 hours.  Radiological Exams on Admission: Ct Abdomen Pelvis W Contrast  02/24/2014    CLINICAL DATA:  Acute lower abdominal pain LEFT greater than RIGHT onset tonight, nausea, vomiting, past history of hysterectomy and appendectomy, hypertension, smoking  EXAM: CT ABDOMEN AND PELVIS WITH CONTRAST  TECHNIQUE: Multidetector CT imaging of the abdomen and pelvis was performed using the standard protocol following bolus administration of intravenous contrast. Sagittal and coronal MPR images reconstructed from axial data set.  CONTRAST:  OMNIPAQUE IOHEXOL 300 MG/ML  SOLN  COMPARISON:  None  FINDINGS: Minimal dependent density lash atelectasis at LEFT lower lobe.  Gallbladder, appendix, and uterus surgically absent with normal sized ovaries.  Liver, spleen, pancreas, kidneys, and adrenal glands normal appearance.  Although under distended, suspect diffuse wall thickening of the transverse and descending colon, questionably extending into the sigmoid colon as well, suspicious for a diffuse colitis.  Stomach and small bowel loops normal appearance.  No mass, adenopathy, hernia, free fluid, or free air.  Mild degenerative disc disease changes thoracic spine and at L5-S1.  IMPRESSION: Suspect diffuse colitis involving the transverse, descending, and likely sigmoid colon.  Differential diagnosis would include infection and inflammatory bowel disease, ischemia considered unlikely due to distribution and lack of significant vascular-occlusive disease changes.   Electronically Signed   By: Ulyses Southward M.D.   On: 02/24/2014 00:18     Assessment/Plan Active Problems:   Colitis   Microcytic anemia   Essential hypertension   Acute renal failure   1. Diffuse colitis - given that patient has had recent antibiotic suspect C. difficile. But since patient also has bloody diarrhea stools studies have been sent including for Escherichia coli. At this time patient has been placed on Cipro and Flagyl and a clear liquid diet and continue with aggressive hydration. 2. Metabolic acidosis - probably from #1.  Continue with aggressive hydration and closely follow metabolic panel. 3. Microcytic hypochromic anemia - given the microcytic and hypochromic picture probably patient has chronic anemia. But since patient also has bloody bowel movement closely follow CBC. Type and screen. Check anemia panel. Patient states she has had a sigmoidoscopy many years ago for irritable bowel syndrome. Patient does take ibuprofen. 4. Hypertension uncontrolled - patient states she does not lisinopril on a regular basis. Closely follow blood pressure trends and and have placed patient on when necessary IV hydralazine. 5. Acute renal failure - given the metabolic acidosis I think patient may be having acute renal failure from dehydration. Check urine sodium and creatinine. Continue with hydration and closely follow. Patient is on lisinopril which may need to be discontinued if there is further worsening of creatinine. Discontinue ibuprofen. 6. Charts mention hypothyroidism - check TSH.  7. Hypokalemia probably from diarrhea - replace and recheck. Check magnesium levels.    Code Status: Full code.  Family Communication: None.  Disposition Plan: Admit to inpatient.    Sharnika Binney N. [facility]  Pager [phone].  If 7PM-7AM, please contact night-coverage [email or web address] Password [email or web address] 02/24/2014, 3:30 AM

## 2014-02-24 NOTE — Progress Notes (Signed)
UR complete 

## 2014-02-24 NOTE — ED Notes (Signed)
Patient ambulated to restroom unassisted.

## 2014-02-24 NOTE — ED Notes (Signed)
Patient ambulated to restroom to collect urine sample.  

## 2014-02-25 LAB — PREPARE RBC (CROSSMATCH)

## 2014-02-25 LAB — BASIC METABOLIC PANEL
ANION GAP: 10 (ref 5–15)
BUN: 9 mg/dL (ref 6–23)
CALCIUM: 8.6 mg/dL (ref 8.4–10.5)
CO2: 24 mEq/L (ref 19–32)
Chloride: 101 mEq/L (ref 96–112)
Creatinine, Ser: 0.93 mg/dL (ref 0.50–1.10)
GFR, EST AFRICAN AMERICAN: 79 mL/min — AB (ref >90)
GFR, EST NON AFRICAN AMERICAN: 68 mL/min — AB (ref >90)
Glucose, Bld: 105 mg/dL — ABNORMAL HIGH (ref 70–99)
Potassium: 4.1 mEq/L (ref 3.7–5.3)
SODIUM: 135 meq/L — AB (ref 137–147)

## 2014-02-25 LAB — CBC
HCT: 25.4 % — ABNORMAL LOW (ref 36.0–46.0)
Hemoglobin: 7.6 g/dL — ABNORMAL LOW (ref 12.0–15.0)
MCH: 21.8 pg — AB (ref 26.0–34.0)
MCHC: 29.9 g/dL — AB (ref 30.0–36.0)
MCV: 73 fL — ABNORMAL LOW (ref 78.0–100.0)
Platelets: 257 10*3/uL (ref 150–400)
RBC: 3.48 MIL/uL — ABNORMAL LOW (ref 3.87–5.11)
RDW: 18 % — AB (ref 11.5–15.5)
WBC: 13.6 10*3/uL — ABNORMAL HIGH (ref 4.0–10.5)

## 2014-02-25 LAB — FERRITIN: Ferritin: 6 ng/mL — ABNORMAL LOW (ref 10–291)

## 2014-02-25 LAB — VITAMIN B12: Vitamin B-12: 226 pg/mL (ref 211–911)

## 2014-02-25 LAB — FOLATE: Folate: 7.3 ng/mL

## 2014-02-25 MED ORDER — SODIUM CHLORIDE 0.9 % IV SOLN
510.0000 mg | Freq: Once | INTRAVENOUS | Status: AC
  Administered 2014-02-25: 510 mg via INTRAVENOUS
  Filled 2014-02-25: qty 17

## 2014-02-25 MED ORDER — SODIUM CHLORIDE 0.9 % IV SOLN
Freq: Once | INTRAVENOUS | Status: AC
  Administered 2014-02-25: 13:00:00 via INTRAVENOUS

## 2014-02-25 MED ORDER — FERROUS GLUCONATE 324 (38 FE) MG PO TABS
324.0000 mg | ORAL_TABLET | Freq: Every day | ORAL | Status: DC
  Administered 2014-02-26 – 2014-02-27 (×2): 324 mg via ORAL
  Filled 2014-02-25 (×3): qty 1

## 2014-02-25 MED ORDER — HYDROMORPHONE HCL 2 MG/ML IJ SOLN
2.0000 mg | INTRAMUSCULAR | Status: DC | PRN
  Administered 2014-02-25 – 2014-02-27 (×5): 2 mg via INTRAVENOUS
  Filled 2014-02-25 (×5): qty 1

## 2014-02-25 MED ORDER — AMLODIPINE BESYLATE 5 MG PO TABS
5.0000 mg | ORAL_TABLET | Freq: Every day | ORAL | Status: DC
  Administered 2014-02-25 – 2014-02-26 (×2): 5 mg via ORAL
  Filled 2014-02-25 (×3): qty 1

## 2014-02-25 NOTE — Progress Notes (Signed)
CARE MANAGEMENT NOTE 02/25/2014  Patient:  Vanessa Peters,Vanessa Peters   Account Number:  [id number]  Date Initiated:  02/24/2014  Documentation initiated by:  Ferdinand CavaSCHETTINO,Makyi Ledo  Subjective/Objective Assessment:   53 yo female admitted with colitis from home with spouse     Action/Plan:   Needs PCP   Anticipated DC Date:  02/26/2014   Anticipated DC Plan:  HOME/SELF CARE      DC Planning Services  CM consult  PCP issues      Choice offered to / List presented to:  C-1 Patient           Status of service:  Completed, signed off Medicare Important Message given?   (If response is "NO", the following Medicare IM given date fields will be blank) Date Medicare IM given:   Medicare IM given by:   Date Additional Medicare IM given:   Additional Medicare IM given by:    Discharge Disposition:    Per UR Regulation:    If discussed at Long Length of Stay Meetings, dates discussed:    Comments:  02/25/14 Ferdinand CavaAndrea Schettino RN BSN CM [phone] Spoke with patient and she stated that she [facility] but has the name of a specific PCP she wants to follow with. This CM provided the patient with the PCP of choice contact information and encouraged her to make a PCP follow up appointment today.  02/24/14 Ferdinand CavaAndrea Schettino RN BSN CM [phone] Spoke with patient and she confirmed that she has UHC coverage and had her card with her. Printed out a list of PCP's within patient area code [facility] and gave to patient. Advised patient to contact PCP of choice from list and this CM will follow up on appointment information.

## 2014-02-25 NOTE — Progress Notes (Signed)
Patient ID: Vanessa BatchSherry Janowski  female  WUJ:[id number]    DOB: 01/31/1961    DOA: 02/23/2014  PCP: No primary care provider on file.  Brief history of present illness Patient is a 53 year old female with hypertension presented to ED with abdominal pain, while waiting in ED, had 5 bloody bowel movements. Described abdominal pain as diffuse, was on antibiotics 2 months ago for bronchitis. CT abdomen and pelvis showed diffuse colitis. Lab work showed microcytic hypochromic anemia otherwise hemodynamically stable. Patient also reported that she has not been to any PCP for last 10 years.  Assessment/Plan: Principal Problem:  Acute diffuse Colitis - CT abdomen showed diffuse colitis involving transverse, descending, sigmoid colon. FOBT positive - C. difficile, Giardia pathogen panel pending, per patient no stool since admission - Continue IV Cipro and Flagyl Flagyl - diet advance to full liquids today, will need screening colonoscopy once symptoms have resolved    Active Problems:   Microcytic anemia: Suggestive of iron deficiency anemia, ferritin 6, iron 10, 2percent saturations  - Place on iron supplements , will give 1 dose of IV iron - Transfuse 1 unit of packed RBCs - Patient also recommended screening colonoscopy once her symptoms have resolved     Essential hypertension - Uncontrolled, continue lisinopril, added Norvasc, PRN hydralazine    Acute renal failure - Improving, likely due to dehydration and acute colitis  - Continue IV hydration     Hyperglycemia Hemoglobin A1c 5.9, borderline diabetes - Consult patient strongly on diet and weight control     profound Hypothyroidism - TSH above 100, total T3 low 0.18 and T4 0.18  - Patient reports that she used to be on Synthroid 75 MCG daily but has not seen any physician in many years. Started patient on Synthroid 75 MCG daily, dose will need to be adjusted according to TSH levels in 4-6 weeks. I strongly advised the patient to  follow-up with PCP, case management consult placed for assistance for PCP.    DVT Prophylaxis:  Code Status: SCDs   Family Communication: discussed with husband at bedside   Disposition:  Consultants:None  Procedures :CT abdomen  Antibiotics    IV Cipro, IV Flagyl 12/10 >  Subjective:  Patient seen and examined, still having abdominal discomfort, no fevers, tolerating clear liquid diet, no diarrhea  Objective: Weight change:   Intake/Output Summary (Last 24 hours) at 02/25/14 1417 Last data filed at 02/25/14 0626  Gross per 24 hour  Intake   1145 ml  Output    700 ml  Net    445 ml   Blood pressure 170/78, pulse 68, temperature 98.1 F (36.7 C), temperature source Oral, resp. rate 18, height 5' 7.5" (1.715 m), weight 112.129 kg (247 lb 3.2 oz), SpO2 100 %.  Physical Exam: General: Alert and awake, oriented x3, not in any acute distress. CVS: S1-S2 clear, no murmur rubs or gallops Chest: clear to auscultation bilaterally, no wheezing, rales or rhonchi Abdomen: soft  mild diffuse tenderness, nondistended, normal bowel sounds  Extremities: no cyanosis, clubbing or edema noted bilaterally Neuro: Cranial nerves II-XII intact, no focal neurological deficits  Lab Results: Basic Metabolic Panel:  Recent Labs Lab 02/24/14 0500 02/25/14 0413  NA 130* 135*  K 3.9 4.1  CL 94* 101  CO2 22 24  GLUCOSE 141* 105*  BUN 14 9  CREATININE 1.06 0.93  CALCIUM 8.9 8.6  MG 2.1  --    Liver Function Tests:  Recent Labs Lab 02/23/14 2222 02/24/14 0500  AST  35 31  ALT 17 15  ALKPHOS 72 70  BILITOT 0.2* <0.2*  PROT 8.3 7.8  ALBUMIN 4.5 4.2    Recent Labs Lab 02/23/14 2222  LIPASE 42   No results for input(s): AMMONIA in the last 168 hours. CBC:  Recent Labs Lab 02/23/14 2222 02/24/14 0500 02/25/14 0413  WBC 17.1* 15.5* 13.6*  NEUTROABS 13.1*  --   --   HGB 8.4* 7.9* 7.6*  HCT 29.2* 27.5* 25.4*  MCV 72.5* 72.9* 73.0*  PLT 360 306 257   Cardiac  Enzymes: No results for input(s): CKTOTAL, CKMB, CKMBINDEX, TROPONINI in the last 168 hours. BNP: Invalid input(s): POCBNP CBG: No results for input(s): GLUCAP in the last 168 hours.   Micro Results: No results found for this or any previous visit (from the past 240 hour(s)).  Studies/Results: Ct Abdomen Pelvis W Contrast  02/24/2014   CLINICAL DATA:  Acute lower abdominal pain LEFT greater than RIGHT onset tonight, nausea, vomiting, past history of hysterectomy and appendectomy, hypertension, smoking  EXAM: CT ABDOMEN AND PELVIS WITH CONTRAST  TECHNIQUE: Multidetector CT imaging of the abdomen and pelvis was performed using the standard protocol following bolus administration of intravenous contrast. Sagittal and coronal MPR images reconstructed from axial data set.  CONTRAST:  100mL OMNIPAQUE IOHEXOL 300 MG/ML  SOLN  COMPARISON:  None  FINDINGS: Minimal dependent density lash atelectasis at LEFT lower lobe.  Gallbladder, appendix, and uterus surgically absent with normal sized ovaries.  Liver, spleen, pancreas, kidneys, and adrenal glands normal appearance.  Although under distended, suspect diffuse wall thickening of the transverse and descending colon, questionably extending into the sigmoid colon as well, suspicious for a diffuse colitis.  Stomach and small bowel loops normal appearance.  No mass, adenopathy, hernia, free fluid, or free air.  Mild degenerative disc disease changes thoracic spine and at L5-S1.  IMPRESSION: Suspect diffuse colitis involving the transverse, descending, and likely sigmoid colon.  Differential diagnosis would include infection and inflammatory bowel disease, ischemia considered unlikely due to distribution and lack of significant vascular-occlusive disease changes.   Electronically Signed   By: Ulyses SouthwardMark  Boles M.D.   On: 02/24/2014 00:18    Medications: Scheduled Meds: . ciprofloxacin  400 mg Intravenous Q12H  . [START ON 02/26/2014] ferrous gluconate  324 mg Oral Q  breakfast  . fluticasone  2 spray Each Nare Daily  . levothyroxine  75 mcg Oral QAC breakfast  . lisinopril  20 mg Oral Daily  . metronidazole  500 mg Intravenous Q8H      LOS: 2 days   Genee Rann M.D. [facility] 02/25/2014, 2:17 PM Pager: [phone]  If 7PM-7AM, please contact night-coverage [email or web address] Password [email or web address]

## 2014-02-26 LAB — BASIC METABOLIC PANEL
Anion gap: 15 (ref 5–15)
BUN: 6 mg/dL (ref 6–23)
CO2: 22 meq/L (ref 19–32)
Calcium: 9.2 mg/dL (ref 8.4–10.5)
Chloride: 100 mEq/L (ref 96–112)
Creatinine, Ser: 0.92 mg/dL (ref 0.50–1.10)
GFR calc Af Amer: 80 mL/min — ABNORMAL LOW (ref >90)
GFR calc non Af Amer: 69 mL/min — ABNORMAL LOW (ref >90)
GLUCOSE: 108 mg/dL — AB (ref 70–99)
Potassium: 3.9 mEq/L (ref 3.7–5.3)
SODIUM: 137 meq/L (ref 137–147)

## 2014-02-26 LAB — TYPE AND SCREEN
ABO/RH(D): B POS
Antibody Screen: NEGATIVE
Unit division: 0

## 2014-02-26 LAB — CBC
HCT: 30.4 % — ABNORMAL LOW (ref 36.0–46.0)
HEMOGLOBIN: 8.9 g/dL — AB (ref 12.0–15.0)
MCH: 21.8 pg — ABNORMAL LOW (ref 26.0–34.0)
MCHC: 29.3 g/dL — AB (ref 30.0–36.0)
MCV: 74.3 fL — AB (ref 78.0–100.0)
Platelets: 304 10*3/uL (ref 150–400)
RBC: 4.09 MIL/uL (ref 3.87–5.11)
RDW: 17.6 % — ABNORMAL HIGH (ref 11.5–15.5)
WBC: 21.6 10*3/uL — ABNORMAL HIGH (ref 4.0–10.5)

## 2014-02-26 LAB — CLOSTRIDIUM DIFFICILE BY PCR: Toxigenic C. Difficile by PCR: NEGATIVE

## 2014-02-26 MED ORDER — CALCIUM CARBONATE ANTACID 500 MG PO CHEW
1.0000 | CHEWABLE_TABLET | Freq: Three times a day (TID) | ORAL | Status: DC
  Administered 2014-02-26: 200 mg via ORAL
  Filled 2014-02-26: qty 1

## 2014-02-26 MED ORDER — CALCIUM CARBONATE ANTACID 500 MG PO CHEW: 1.0000 | CHEWABLE_TABLET | Freq: Three times a day (TID) | ORAL | Status: DC | PRN

## 2014-02-26 NOTE — Progress Notes (Signed)
PATIENT DETAILS Name: Vanessa Peters Age: 53 y.o. Sex: female Date of Birth: 02/20/1961 Admit Date: 02/23/2014 Admitting Physician Eduard ClosArshad N Kakrakandy, MD PCP:No primary care provider on file.  Subjective: Requesting discharge!Still with intermittent hematochezia, but denies any abdominal pain.  Assessment/Plan: Principal Problem:   Colitis:suspect infectious. Recent exposure to Abx in the past few months for "bronchitis". CT Abd showed diffuse colitis involving transverse, descending and sigmoid colon. Await CDiff PCR and GI pathogen panel. Continue full liquids. Continue empiric Cipro/Flagyl. Abd is completely soft on exam, has a non toxic appearance but leukocytosis has increased to 21 K this am. Will continue to monitor closely.  Active Problems: Fe Deficiency Anemia:Anemic on initial presentation, is microcytic with iron studies consistent with Fe def anemia. Patient claims she [place] [facility] [facility] [place], claims she will re-establish care with her GI MD in [place] on discharge. Does not want to establish care here in [place].She is aware she needs a colonoscopy once acute colitis has resolved, to rule out malignancy.Continue Iron supplementation.  ZOX:[id number]:resolved with IVF. Likely pre-renal.  Hypothyroidism:continue Levothyroxine.TSH >100, suspect she has been non compliant in the past, started on 75 mcg of Levothyroxine, she will need a repeat TSH in 3 months.  JWJ:XBJYHTN:much better control. Continue current regimen of Lisinopril and Amlodipine. Acknowledges noncomplicane as outpatient  Non Compliance to medications/follow NW:[id number]:counseled extensively.See above  Disposition: Remain inpatient  Antibiotics:  See below   Anti-infectives    Start     Dose/Rate Route Frequency Ordered Stop   02/24/14 1000  ciprofloxacin (CIPRO) IVPB 400 mg     400 mg200 mL/hr over 60 Minutes Intravenous Every 12 hours 02/24/14 0545     02/24/14 0800  metroNIDAZOLE (FLAGYL) IVPB 500  mg     500 mg100 mL/hr over 60 Minutes Intravenous Every 8 hours 02/24/14 0328     02/24/14 0145  ciprofloxacin (CIPRO) IVPB 400 mg     400 mg200 mL/hr over 60 Minutes Intravenous  Once 02/24/14 0135 02/24/14 0504   02/24/14 0145  metroNIDAZOLE (FLAGYL) IVPB 500 mg     500 mg100 mL/hr over 60 Minutes Intravenous  Once 02/24/14 0135 02/24/14 [id number]      DVT Prophylaxis: SCD's  Code Status: Full code   Family Communication None at bedside  Procedures:  None  CONSULTS:  None  Time spent 40 minutes-which includes 50% of the time with face-to-face with patient/ family and coordinating care related to the above assessment and plan.  MEDICATIONS: Scheduled Meds: . amLODipine  5 mg Oral Daily  . calcium carbonate  1 tablet Oral TID  . ciprofloxacin  400 mg Intravenous Q12H  . ferrous gluconate  324 mg Oral Q breakfast  . fluticasone  2 spray Each Nare Daily  . levothyroxine  75 mcg Oral QAC breakfast  . lisinopril  20 mg Oral Daily  . metronidazole  500 mg Intravenous Q8H   Continuous Infusions:  PRN Meds:.acetaminophen **OR** acetaminophen, hydrALAZINE, HYDROmorphone (DILAUDID) injection, ondansetron **OR** ondansetron (ZOFRAN) IV    PHYSICAL EXAM: Vital signs in last 24 hours: Filed Vitals:   02/25/14 1527 02/25/14 2146 02/26/14 0548 02/26/14 1330  BP: 153/75 177/73 156/71 141/75  Pulse: 68 85 74 76  Temp:  98.6 F (37 C) 98.7 F (37.1 C) 98.6 F (37 C)  TempSrc: Oral Oral Oral Oral  Resp: 18 18 18 18   Height:      Weight:      SpO2:  100% 98% 97% 95%    Weight change:  Filed Weights   02/24/14 0252  Weight: 112.129 kg (247 lb 3.2 oz)   Body mass index is 38.12 kg/(m^2).   Gen Exam: Awake and alert with clear speech.   Neck: Supple, No JVD.   Chest: B/L Clear.   CVS: S1 S2 Regular, no murmurs.  Abdomen: soft, BS +, non tender, non distended.  Extremities: no edema, lower extremities warm to touch. Neurologic: Non Focal.   Skin: No Rash.   Wounds:  N/A.   Intake/Output from previous day:  Intake/Output Summary (Last 24 hours) at 02/26/14 1345 Last data filed at 02/26/14 1300  Gross per 24 hour  Intake   1325 ml  Output      0 ml  Net   1325 ml     LAB RESULTS: CBC  Recent Labs Lab 02/23/14 2222 02/24/14 0500 02/25/14 0413 02/26/14 0522  WBC 17.1* 15.5* 13.6* 21.6*  HGB 8.4* 7.9* 7.6* 8.9*  HCT 29.2* 27.5* 25.4* 30.4*  PLT 360 306 257 304  MCV 72.5* 72.9* 73.0* 74.3*  MCH 20.8* 21.0* 21.8* 21.8*  MCHC 28.8* 28.7* 29.9* 29.3*  RDW 17.8* 17.8* 18.0* 17.6*  LYMPHSABS 2.1  --   --   --   MONOABS 1.5*  --   --   --   EOSABS 0.2  --   --   --   BASOSABS 0.2*  --   --   --     Chemistries   Recent Labs Lab 02/23/14 2222 02/24/14 0500 02/25/14 0413 02/26/14 0522  NA 130* 130* 135* 137  K 3.0* 3.9 4.1 3.9  CL 91* 94* 101 100  CO2 17* 22 24 22   GLUCOSE 130* 141* 105* 108*  BUN 16 14 9 6   CREATININE 1.25* 1.06 0.93 0.92  CALCIUM 10.1 8.9 8.6 9.2  MG  --  2.1  --   --     CBG: No results for input(s): GLUCAP in the last 168 hours.  GFR Estimated Creatinine Clearance: 91 mL/min (by C-G formula based on Cr of 0.92).  Coagulation profile  Recent Labs Lab 02/23/14 2222  INR 1.12    Cardiac Enzymes No results for input(s): CKMB, TROPONINI, MYOGLOBIN in the last 168 hours.  Invalid input(s): CK  Invalid input(s): POCBNP No results for input(s): DDIMER in the last 72 hours.  Recent Labs  02/24/14 1111  HGBA1C 5.9*   No results for input(s): CHOL, HDL, LDLCALC, TRIG, CHOLHDL, LDLDIRECT in the last 72 hours.  Recent Labs  02/24/14 0500  TSH >100.000*    Recent Labs  02/24/14 0500  VITAMINB12 226  FOLATE 7.3  FERRITIN 6*  TIBC 486*  IRON 10*  RETICCTPCT 1.2    Recent Labs  02/23/14 2222  LIPASE 42    Urine Studies No results for input(s): UHGB, CRYS in the last 72 hours.  Invalid input(s): UACOL, UAPR, USPG, UPH, UTP, UGL, UKET, UBIL, UNIT, UROB, ULEU, UEPI, UWBC, URBC,  UBAC, CAST, UCOM, BILUA  MICROBIOLOGY: No results found for this or any previous visit (from the past 240 hour(s)).  RADIOLOGY STUDIES/RESULTS: Ct Abdomen Pelvis W Contrast  02/24/2014   CLINICAL DATA:  Acute lower abdominal pain LEFT greater than RIGHT onset tonight, nausea, vomiting, past history of hysterectomy and appendectomy, hypertension, smoking  EXAM: CT ABDOMEN AND PELVIS WITH CONTRAST  TECHNIQUE: Multidetector CT imaging of the abdomen and pelvis was performed using the standard protocol following bolus administration of intravenous contrast. Sagittal and  coronal MPR images reconstructed from axial data set.  CONTRAST:  OMNIPAQUE IOHEXOL 300 MG/ML  SOLN  COMPARISON:  None  FINDINGS: Minimal dependent density lash atelectasis at LEFT lower lobe.  Gallbladder, appendix, and uterus surgically absent with normal sized ovaries.  Liver, spleen, pancreas, kidneys, and adrenal glands normal appearance.  Although under distended, suspect diffuse wall thickening of the transverse and descending colon, questionably extending into the sigmoid colon as well, suspicious for a diffuse colitis.  Stomach and small bowel loops normal appearance.  No mass, adenopathy, hernia, free fluid, or free air.  Mild degenerative disc disease changes thoracic spine and at L5-S1.  IMPRESSION: Suspect diffuse colitis involving the transverse, descending, and likely sigmoid colon.  Differential diagnosis would include infection and inflammatory bowel disease, ischemia considered unlikely due to distribution and lack of significant vascular-occlusive disease changes.   Electronically Signed   By: Ulyses Southward M.D.   On: 02/24/2014 00:18    Jeoffrey Massed, MD  [facility] Pager:[phone]  If 7PM-7AM, please contact night-coverage [email or web address] Password [email or web address] 02/26/2014, 1:45 PM   LOS: 3 days

## 2014-02-27 LAB — BASIC METABOLIC PANEL
Anion gap: 11 (ref 5–15)
BUN: 6 mg/dL (ref 6–23)
CALCIUM: 9.3 mg/dL (ref 8.4–10.5)
CHLORIDE: 101 meq/L (ref 96–112)
CO2: 25 meq/L (ref 19–32)
CREATININE: 1.06 mg/dL (ref 0.50–1.10)
GFR calc non Af Amer: 58 mL/min — ABNORMAL LOW (ref >90)
GFR, EST AFRICAN AMERICAN: 68 mL/min — AB (ref >90)
Glucose, Bld: 112 mg/dL — ABNORMAL HIGH (ref 70–99)
Potassium: 3.5 mEq/L — ABNORMAL LOW (ref 3.7–5.3)
Sodium: 137 mEq/L (ref 137–147)

## 2014-02-27 LAB — CBC
HEMATOCRIT: 28.5 % — AB (ref 36.0–46.0)
Hemoglobin: 8.5 g/dL — ABNORMAL LOW (ref 12.0–15.0)
MCH: 22.1 pg — AB (ref 26.0–34.0)
MCHC: 29.8 g/dL — ABNORMAL LOW (ref 30.0–36.0)
MCV: 74.2 fL — ABNORMAL LOW (ref 78.0–100.0)
Platelets: 298 10*3/uL (ref 150–400)
RBC: 3.84 MIL/uL — ABNORMAL LOW (ref 3.87–5.11)
RDW: 17.9 % — AB (ref 11.5–15.5)
WBC: 16.7 10*3/uL — AB (ref 4.0–10.5)

## 2014-02-27 MED ORDER — FERROUS GLUCONATE 324 (38 FE) MG PO TABS: 324.0000 mg | ORAL_TABLET | Freq: Every day | ORAL | Status: AC

## 2014-02-27 MED ORDER — CIPROFLOXACIN HCL 500 MG PO TABS: 500.0000 mg | ORAL_TABLET | Freq: Two times a day (BID) | ORAL | Status: AC

## 2014-02-27 MED ORDER — METRONIDAZOLE 500 MG PO TABS: 500.0000 mg | ORAL_TABLET | Freq: Three times a day (TID) | ORAL | Status: AC

## 2014-02-27 MED ORDER — AMLODIPINE BESYLATE 10 MG PO TABS: 10.0000 mg | ORAL_TABLET | Freq: Every day | ORAL | Status: AC

## 2014-02-27 MED ORDER — LEVOTHYROXINE SODIUM 75 MCG PO TABS: 75.0000 ug | ORAL_TABLET | Freq: Every day | ORAL | Status: AC

## 2014-02-27 MED ORDER — LISINOPRIL 20 MG PO TABS: 20.0000 mg | ORAL_TABLET | Freq: Every day | ORAL | Status: AC

## 2014-02-27 MED ORDER — AMLODIPINE BESYLATE 10 MG PO TABS
10.0000 mg | ORAL_TABLET | Freq: Every day | ORAL | Status: DC
  Administered 2014-02-27: 10 mg via ORAL
  Filled 2014-02-27: qty 1

## 2014-02-27 NOTE — Progress Notes (Signed)
ANTIBIOTIC CONSULT NOTE - Follow up  Pharmacy Consult for Cipro Indication: Colitis  Allergies  Allergen Reactions  . Sulfa Antibiotics Shortness Of Breath and Swelling    Throat swells     Patient Measurements: Height: 5' 7.5" (171.5 cm) Weight: 247 lb 3.2 oz (112.129 kg) IBW/kg (Calculated) : 62.75   Vital Signs: Temp: 98.3 F (36.8 C) (12/13 0920) Temp Source: Oral (12/13 0920) BP: 189/84 mmHg (12/13 0920) Pulse Rate: 80 (12/13 0920) Intake/Output from previous day: 12/12 0701 - 12/13 0700 In: 360 [P.O.:360] Out: -  Intake/Output from this shift:    Labs:  Recent Labs  02/25/14 0413 02/26/14 0522 02/27/14 0535  WBC 13.6* 21.6* 16.7*  HGB 7.6* 8.9* 8.5*  PLT 257 304 298  CREATININE 0.93 0.92 1.06   Estimated Creatinine Clearance: 79 mL/min (by C-G formula based on Cr of 1.06). No results for input(s): VANCOTROUGH, VANCOPEAK, VANCORANDOM, GENTTROUGH, GENTPEAK, GENTRANDOM, TOBRATROUGH, TOBRAPEAK, TOBRARND, AMIKACINPEAK, AMIKACINTROU, AMIKACIN in the last 72 hours.   Microbiology: Recent Results (from the past 720 hour(s))  Clostridium Difficile by PCR     Status: None   Collection Time: 02/26/14 12:00 PM  Result Value Ref Range Status   C difficile by pcr NEGATIVE NEGATIVE Final    Comment: [facility]    Medical History: Past Medical History  Diagnosis Date  . Hypertension   . Thyroid disease   . Hypothyroidism     Medications:  Anti-infectives    Start     Dose/Rate Route Frequency Ordered Stop   02/24/14 1000  ciprofloxacin (CIPRO) IVPB 400 mg     400 mg200 mL/hr over 60 Minutes Intravenous Every 12 hours 02/24/14 0545     02/24/14 0800  metroNIDAZOLE (FLAGYL) IVPB 500 mg     500 mg100 mL/hr over 60 Minutes Intravenous Every 8 hours 02/24/14 0328     02/24/14 0145  ciprofloxacin (CIPRO) IVPB 400 mg     400 mg200 mL/hr over 60 Minutes Intravenous  Once 02/24/14 0135 02/24/14 0504   02/24/14 0145  metroNIDAZOLE (FLAGYL) IVPB  500 mg     500 mg100 mL/hr over 60 Minutes Intravenous  Once 02/24/14 0135 02/24/14 [id number]     Assessment: 53 yo female presents with abdominal pain and 5 bloody bowel movements in ED. CT abdomen and pelvis showed diffuse colitis. Reports taking antibiotics 2 months ago for bronchitis.  12/10 >> Cipro >> 12/10 >> Flagyl >>  Tmax: AF WBCs: 21.6 back down to 16.7  Renal: SCr 1.06, CrCl ~ 79CG  12/12 C.diff PCR: neg  Goal of Therapy:  Appropriate antibiotic dosing for renal function; eradication of infection  Plan:  Cont Cipro 400mg  IV q12h.  Dose adjustments are not likely to be necessary so pharmacy will dc follow-up.  Consider switching to PO abx as soon as appropriate.   Charolotte Ekeom Khian Remo, PharmD, pager [phone]. 02/27/2014,12:21 PM.

## 2014-02-27 NOTE — Progress Notes (Addendum)
Patient discharged home with instructions given on medications,and follow up visits,patient verbalized understanding. Prescriptions sent with patient. Accompanied by staff to an awaiting vehicle. 

## 2014-02-27 NOTE — Discharge Summary (Signed)
Physician Discharge Summary  Vanessa Peters ZOX:[id number] DOB: 08-Mar-1961 DOA: 02/23/2014  PCP: No primary care provider on file.  Admit date: 02/23/2014 Discharge date: 02/27/2014  Time spent: 45 minutes  Recommendations for Outpatient Follow-up:  1. Follow up with PCP in 1 week  2. Patient will need TSH repeat in 3-4 weeks  3. Outpatient BP monitoring  Discharge Diagnoses:  Principal Problem:   Colitis Active Problems:   Microcytic anemia   Essential hypertension   Acute renal failure   Hyperglycemia   Hypothyroidism   Bloody diarrhea   Discharge Condition: stable  Diet recommendation: regular  Filed Weights   02/24/14 0252  Weight: 112.129 kg (247 lb 3.2 oz)    History of present illness:  Vanessa Peters is a 53 y.o. female with history of hypertension presents to the ER because of abdominal pain. While waiting in the ER patient had 5 bloody bowel movements. His abdominal pain is diffuse. Patient has had antibiotics 2 months ago for bronchitis. CT abdomen and pelvis done in the ER shows diffuse colitis. Patient's lab show macrocytic hypochromic anemia. Patient is hemodynamically stable otherwise. Patient denies any recent travel or any sick contacts. Patient is afebrile. Denies consuming any" further be from recently. Patient was constipated 2 days ago and had taken per rectal suppository yesterday. Patient has history of hypertension and does not take antihypertensive on a regular basis.   Hospital Course:  Colitis:suspect infectious, significantly improved with antibiotics and patient has not had further diarrhea or blood in her stool while admitted. C diff returned negative. Patient was initially on a clear liquid diet, was advanced to regular and has been tolerating it well without abdominal pain, nausea or vomiting. She will be discharged home on 7 additional days of empiric antibiotics. She has a PCP in [place] and will call first thing Monday morning for a  follow up appointment in 1 week.  Fe Deficiency Anemia:Anemic on initial presentation, is microcytic with iron studies consistent with Fe def anemia. Patient claims she used to follow up with GI MD in [facility], claims she will re-establish care with her GI MD in [place] on discharge. Does not want to establish care here in [place]. She is aware she needs a colonoscopy once acute colitis has resolved, to rule out malignancy. Continue Iron supplementation on discharge [id number] with IVF. Likely pre-renal. Hypothyroidism:continue Levothyroxine.TSH >100, she has been non compliant in the past, started on 75 mcg of Levothyroxine, she will need a repeat TSH in 3 months. HTN: her Amlodipine dose was uptitrated to 10 mg and patient with fair control on discharge. Recommend close outpatient follow up. Counseled against using Ibuprofen as can have worsening HTN. Non Compliance to medications/follow AO:[id number] extensively, she states that she hasn't had her medications since she did not go to any doctor. On discharge she received a month supply with 1 refill of all of her prescription medications and she will follow up with her PCP as an outpatient as above.   Procedures:  None    Consultations:  None   Discharge Exam: Filed Vitals:   02/26/14 2200 02/27/14 0500 02/27/14 0920 02/27/14 1239  BP: 185/79 185/79 189/84 168/74  Pulse: 88 78 80 71  Temp: 99.4 F (37.4 C) 98.5 F (36.9 C) 98.3 F (36.8 C)   TempSrc: Oral Oral Oral   Resp: 18 20    Height:      Weight:      SpO2: 97% 97% 97%  General: NAD Cardiovascular: RRR Respiratory: CTA biL  Discharge Instructions     Medication List    STOP taking these medications        ibuprofen 200 MG tablet  Commonly known as:  ADVIL,MOTRIN      TAKE these medications        amLODipine 10 MG tablet  Commonly known as:  NORVASC  Take 1 tablet (10 mg total) by mouth daily.     ciprofloxacin 500 MG tablet  Commonly known as:  CIPRO   Take 1 tablet (500 mg total) by mouth 2 (two) times daily.     ferrous gluconate 324 MG tablet  Commonly known as:  FERGON  Take 1 tablet (324 mg total) by mouth daily with breakfast.     levothyroxine 75 MCG tablet  Commonly known as:  SYNTHROID, LEVOTHROID  Take 1 tablet (75 mcg total) by mouth daily before breakfast.     lisinopril 20 MG tablet  Commonly known as:  PRINIVIL,ZESTRIL  Take 1 tablet (20 mg total) by mouth daily.     metroNIDAZOLE 500 MG tablet  Commonly known as:  FLAGYL  Take 1 tablet (500 mg total) by mouth 3 (three) times daily.     mometasone 50 MCG/ACT nasal spray  Commonly known as:  NASONEX  Place 1 spray into the nose 2 (two) times daily.           Follow-up Information    Follow up with PCP In 1 week.      The results of significant diagnostics from this hospitalization (including imaging, microbiology, ancillary and laboratory) are listed below for reference.    Significant Diagnostic Studies: Ct Abdomen Pelvis W Contrast  02/24/2014   CLINICAL DATA:  Acute lower abdominal pain LEFT greater than RIGHT onset tonight, nausea, vomiting, past history of hysterectomy and appendectomy, hypertension, smoking  EXAM: CT ABDOMEN AND PELVIS WITH CONTRAST  TECHNIQUE: Multidetector CT imaging of the abdomen and pelvis was performed using the standard protocol following bolus administration of intravenous contrast. Sagittal and coronal MPR images reconstructed from axial data set.  CONTRAST:  100mL OMNIPAQUE IOHEXOL 300 MG/ML  SOLN  COMPARISON:  None  FINDINGS: Minimal dependent density lash atelectasis at LEFT lower lobe.  Gallbladder, appendix, and uterus surgically absent with normal sized ovaries.  Liver, spleen, pancreas, kidneys, and adrenal glands normal appearance.  Although under distended, suspect diffuse wall thickening of the transverse and descending colon, questionably extending into the sigmoid colon as well, suspicious for a diffuse colitis.  Stomach  and small bowel loops normal appearance.  No mass, adenopathy, hernia, free fluid, or free air.  Mild degenerative disc disease changes thoracic spine and at L5-S1.  IMPRESSION: Suspect diffuse colitis involving the transverse, descending, and likely sigmoid colon.  Differential diagnosis would include infection and inflammatory bowel disease, ischemia considered unlikely due to distribution and lack of significant vascular-occlusive disease changes.   Electronically Signed   By: Ulyses SouthwardMark  Boles M.D.   On: 02/24/2014 00:18    Microbiology: Recent Results (from the past 240 hour(s))  Clostridium Difficile by PCR     Status: None   Collection Time: 02/26/14 12:00 PM  Result Value Ref Range Status   C difficile by pcr NEGATIVE NEGATIVE Final    Comment: Performed at [facility]     Labs: Basic Metabolic Panel:  Recent Labs Lab 02/23/14 2222 02/24/14 0500 02/25/14 0413 02/26/14 0522 02/27/14 0535  NA 130* 130* 135* 137 137  K 3.0* 3.9 4.1  3.9 3.5*  CL 91* 94* 101 100 101  CO2 17* 22 24 22 25   GLUCOSE 130* 141* 105* 108* 112*  BUN 16 14 9 6 6   CREATININE 1.25* 1.06 0.93 0.92 1.06  CALCIUM 10.1 8.9 8.6 9.2 9.3  MG  --  2.1  --   --   --    Liver Function Tests:  Recent Labs Lab 02/23/14 2222 02/24/14 0500  AST 35 31  ALT 17 15  ALKPHOS 72 70  BILITOT 0.2* <0.2*  PROT 8.3 7.8  ALBUMIN 4.5 4.2    Recent Labs Lab 02/23/14 2222  LIPASE 42   No results for input(s): AMMONIA in the last 168 hours. CBC:  Recent Labs Lab 02/23/14 2222 02/24/14 0500 02/25/14 0413 02/26/14 0522 02/27/14 0535  WBC 17.1* 15.5* 13.6* 21.6* 16.7*  NEUTROABS 13.1*  --   --   --   --   HGB 8.4* 7.9* 7.6* 8.9* 8.5*  HCT 29.2* 27.5* 25.4* 30.4* 28.5*  MCV 72.5* 72.9* 73.0* 74.3* 74.2*  PLT 360 306 257 304 298    Signed:  GHERGHE, COSTIN  [facility] 02/27/2014, 1:22 PM

## 2014-02-27 NOTE — Progress Notes (Signed)
Patient's blood pressure 189/84,Dr Gherghe notified. Will continue to monitor patient.

## 2014-02-27 NOTE — Discharge Instructions (Signed)
Follow with Primary MD in 5-7 days  Recheck a TSH in 4 weeks Take synthroid daily in am 30 minutes before breakfast. Take this medication alone without any other medications or supplements.   Please get a complete blood count and chemistry panel checked by your Primary MD at your next visit, and again as instructed by your Primary MD. Please get your medications reviewed and adjusted by your Primary MD.  Please request your Primary MD to go over all Hospital Tests and Procedure/Radiological results at the follow up, please get all Hospital records sent to your Prim MD by signing hospital release before you go home.  If you had Pneumonia of Lung problems at the Hospital: Please get a 2 view Chest X ray done in 6-8 weeks after hospital discharge or sooner if instructed by your Primary MD.  If you have Congestive Heart Failure: Please call your Cardiologist or Primary MD anytime you have any of the following symptoms:  1) 3 pound weight gain in 24 hours or 5 pounds in 1 week  2) shortness of breath, with or without a dry hacking cough  3) swelling in the hands, feet or stomach  4) if you have to sleep on extra pillows at night in order to breathe  Follow cardiac low salt diet and 1.5 lit/day fluid restriction.  If you have diabetes Accuchecks 4 times/day, Once in AM empty stomach and then before each meal. Log in all results and show them to your primary doctor at your next visit. If any glucose reading is under 80 or above 300 call your primary MD immediately.  If you have Seizure/Convulsions/Epilepsy: Please do not drive, operate heavy machinery, participate in activities at heights or participate in high speed sports until you have seen by Primary MD or a Neurologist and advised to do so again.  If you had Gastrointestinal Bleeding: Please ask your Primary MD to check a complete blood count within one week of discharge or at your next visit. Your endoscopic/colonoscopic biopsies that  are pending at the time of discharge, will also need to followed by your Primary MD.  Get Medicines reviewed and adjusted. Please take all your medications with you for your next visit with your Primary MD  Please request your Primary MD to go over all hospital tests and procedure/radiological results at the follow up, please ask your Primary MD to get all Hospital records sent to his/her office.  If you experience worsening of your admission symptoms, develop shortness of breath, life threatening emergency, suicidal or homicidal thoughts you must seek medical attention immediately by calling 911 or calling your MD immediately  if symptoms less severe.  You must read complete instructions/literature along with all the possible adverse reactions/side effects for all the Medicines you take and that have been prescribed to you. Take any new Medicines after you have completely understood and accpet all the possible adverse reactions/side effects.   Do not drive or operate heavy machinery when taking Pain medications.   Do not take more than prescribed Pain, Sleep and Anxiety Medications  Special Instructions: If you have smoked or chewed Tobacco  in the last 2 yrs please stop smoking, stop any regular Alcohol  and or any Recreational drug use.  Wear Seat belts while driving.  Please note You were cared for by a hospitalist during your hospital stay. If you have any questions about your discharge medications or the care you received while you were in the hospital after you are discharged,  you can call the unit and asked to speak with the hospitalist on call if the hospitalist that took care of you is not available. Once you are discharged, your primary care physician will handle any further medical issues. Please note that NO REFILLS for any discharge medications will be authorized once you are discharged, as it is imperative that you return to your primary care physician (or establish a relationship  with a primary care physician if you do not have one) for your aftercare needs so that they can reassess your need for medications and monitor your lab values.  You can reach the hospitalist office at phone [phone] or fax [phone]   If you do not have a primary care physician, you can call [phone] for a physician referral.  Activity: As tolerated with Full fall precautions use walker/cane & assistance as needed  Diet: heart healthy  Disposition Home

## 2014-02-27 NOTE — Progress Notes (Deleted)
Patient c/o pain level not being met with Dilaudid PCA dose,Dr Jonelle Sidle notified. Will continue to nonitor patient. Awaiting MD's order.Patient informed.

## 2015-09-25 IMAGING — CT CT ABD-PELV W/ CM
1 of 3 series · 14 of 32 positions shown, 19 images · IV contrast (omnipaque)
Comparison: None

CLINICAL DATA: Acute lower abdominal pain LEFT greater than RIGHT
onset tonight, nausea, vomiting, past history of hysterectomy and
appendectomy, hypertension, smoking

EXAM:
CT ABDOMEN AND PELVIS WITH CONTRAST
TECHNIQUE: Multidetector CT imaging of the abdomen and pelvis was performed
using the standard protocol following bolus administration of
intravenous contrast. Sagittal and coronal MPR images reconstructed
from axial data set.
CONTRAST:  100mL OMNIPAQUE IOHEXOL 300 MG/ML  SOLN

[Series 2: abd/pel with · axial · 0.79mm/px · z∈[+898,+1298]mm · 14 of 92 slices shown, 19 images]
[im 6/92  soft-tissue]
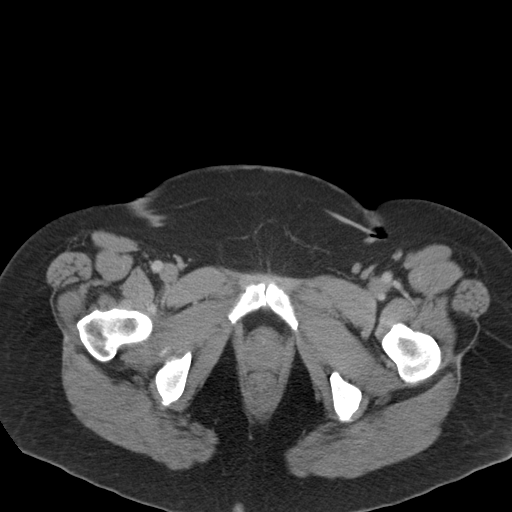
[im 6/92  bone]
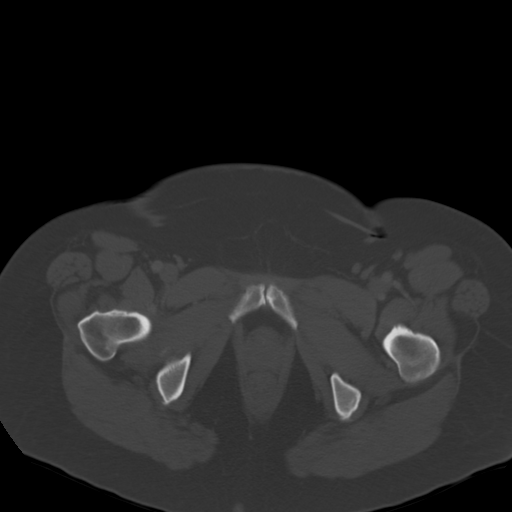
[im 11/92  soft-tissue]
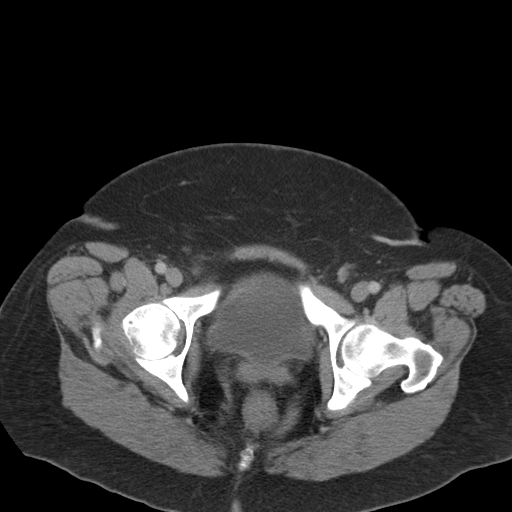
[im 21/92  soft-tissue]
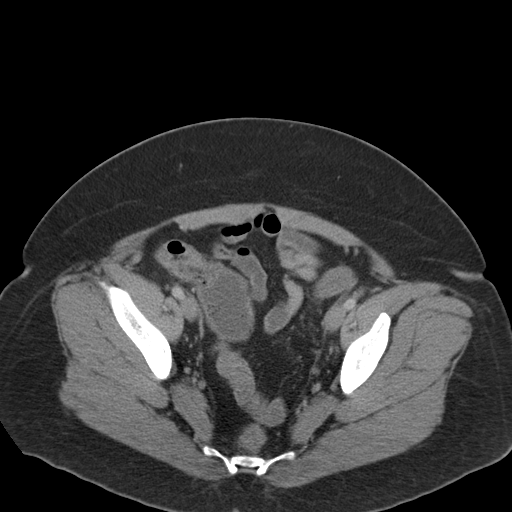
[im 26/92  soft-tissue]
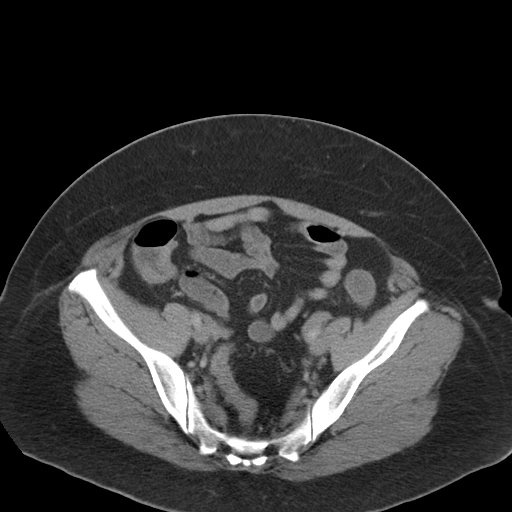
[im 31/92  soft-tissue]
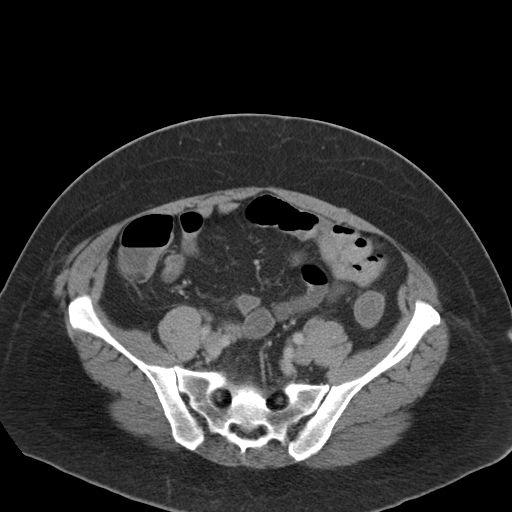
[im 41/92  soft-tissue]
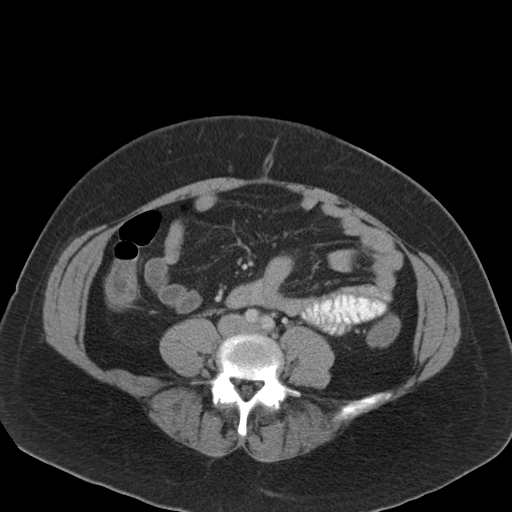
[im 46/92  soft-tissue]
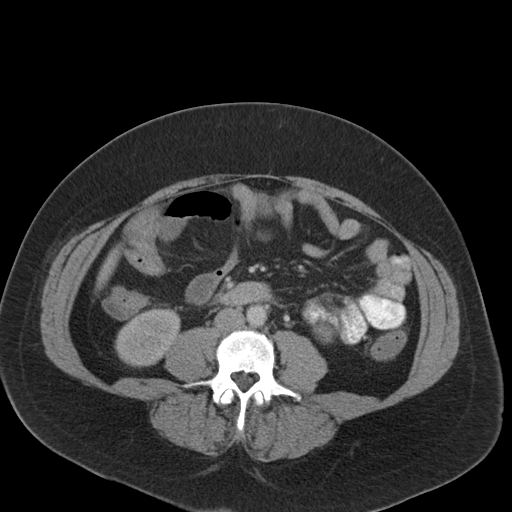
[im 51/92  soft-tissue]
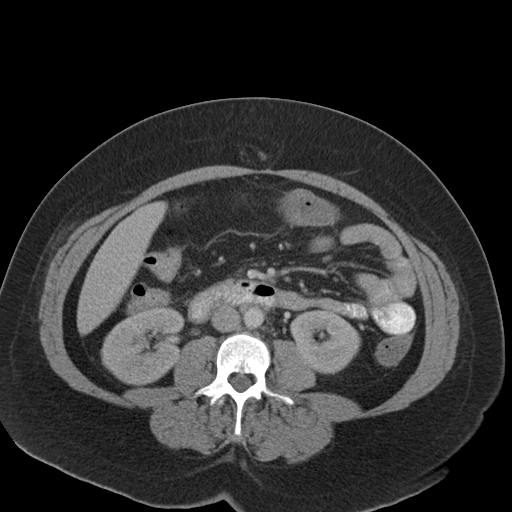
[im 61/92  soft-tissue]
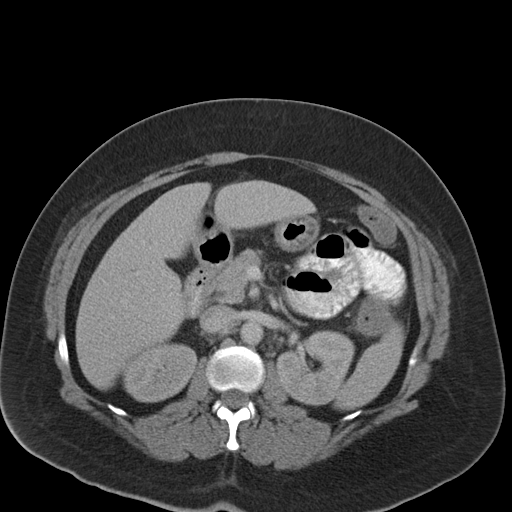
[im 61/92  bone]
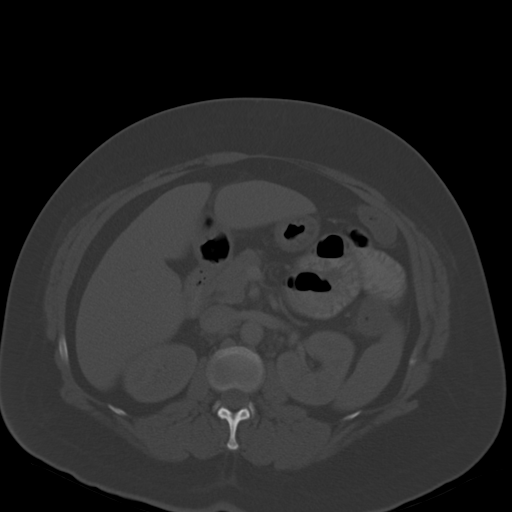
[im 66/92  soft-tissue]
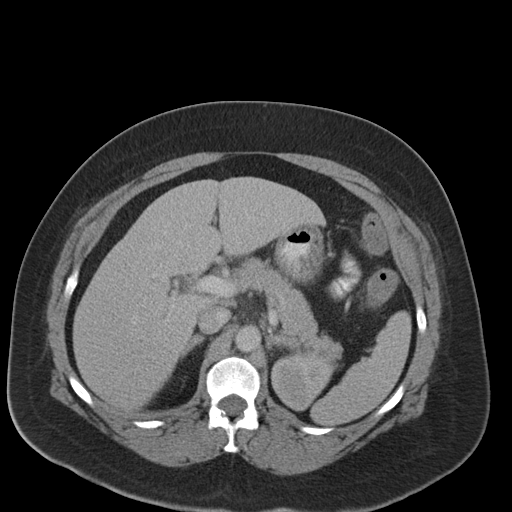
[im 71/92  soft-tissue]
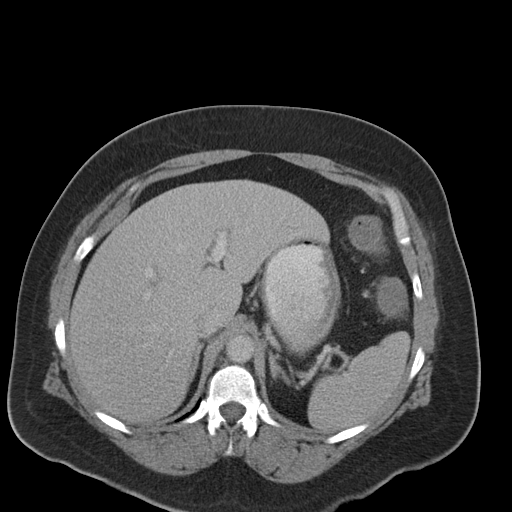
[im 71/92  lung]
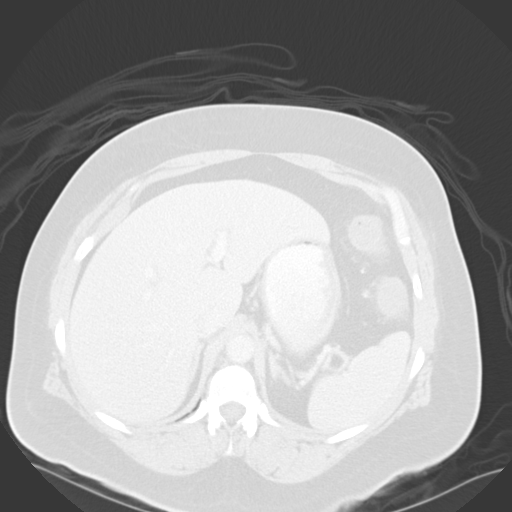
[im 76/92  lung]
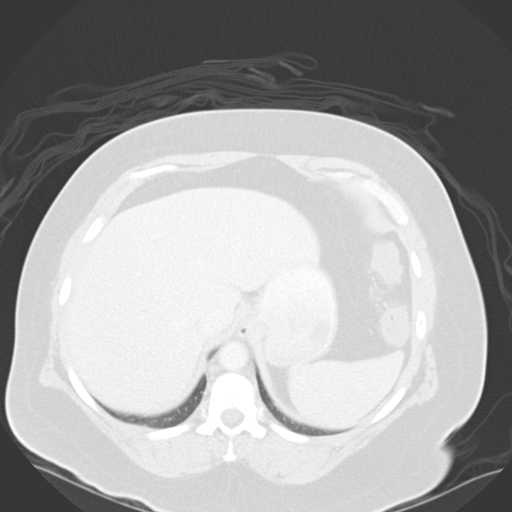
[im 81/92  soft-tissue]
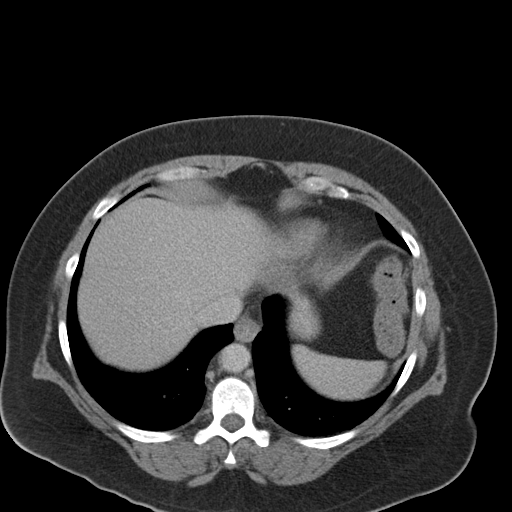
[im 81/92  lung]
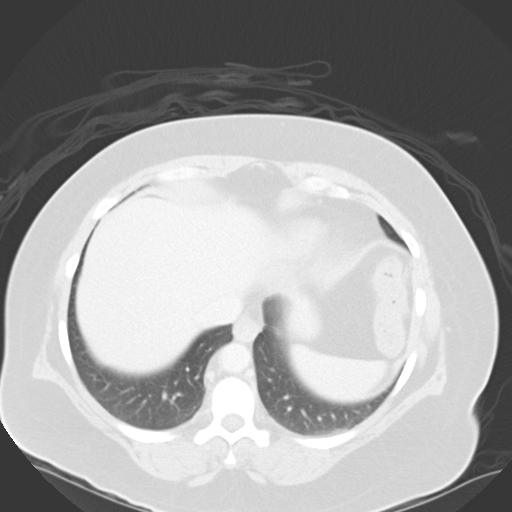
[im 86/92  soft-tissue]
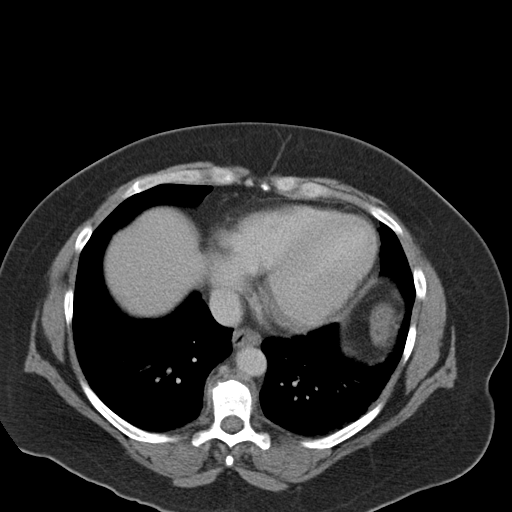
[im 86/92  lung]
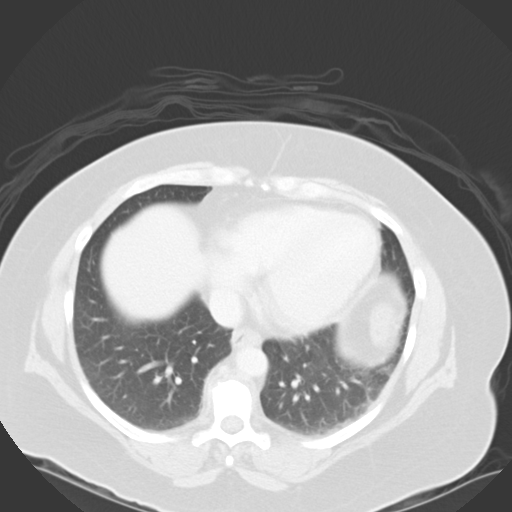

[14 of 32 positions shown; findings below may reference images not displayed]

FINDINGS: Minimal dependent density Babacan atelectasis at LEFT lower lobe.

Gallbladder, appendix, and uterus surgically absent with normal
sized ovaries.

Liver, spleen, pancreas, kidneys, and adrenal glands normal
appearance.

Although under distended, suspect diffuse wall thickening of the
transverse and descending colon, questionably extending into the
sigmoid colon as well, suspicious for a diffuse colitis.

Stomach and small bowel loops normal appearance.

No mass, adenopathy, hernia, free fluid, or free air.

Mild degenerative disc disease changes thoracic spine and at L5-S1.
IMPRESSION: Suspect diffuse colitis involving the transverse, descending, and
likely sigmoid colon.

Differential diagnosis would include infection and inflammatory
bowel disease, ischemia considered unlikely due to distribution and
lack of significant vascular-occlusive disease changes.
